# Patient Record
Sex: Female | Born: 1965 | Race: White | Hispanic: No | Marital: Married | Smoking: Never smoker
Health system: Southern US, Community
[De-identification: ages and names within clinical notes are randomized; demographics above are authoritative.]

## PROBLEM LIST (undated history)

## (undated) DIAGNOSIS — I10 Essential (primary) hypertension: Secondary | ICD-10-CM

## (undated) DIAGNOSIS — N200 Calculus of kidney: Secondary | ICD-10-CM

## (undated) DIAGNOSIS — F32A Depression, unspecified: Secondary | ICD-10-CM

## (undated) DIAGNOSIS — G4730 Sleep apnea, unspecified: Secondary | ICD-10-CM

## (undated) DIAGNOSIS — F329 Major depressive disorder, single episode, unspecified: Secondary | ICD-10-CM

## (undated) DIAGNOSIS — K219 Gastro-esophageal reflux disease without esophagitis: Secondary | ICD-10-CM

## (undated) HISTORY — PX: CHOLECYSTECTOMY: SHX55

## (undated) HISTORY — PX: GASTRIC BYPASS: SHX52

## (undated) HISTORY — DX: Calculus of kidney: N20.0

---

## 2011-02-12 DIAGNOSIS — J019 Acute sinusitis, unspecified: Secondary | ICD-10-CM | POA: Insufficient documentation

## 2015-06-20 DIAGNOSIS — Z8742 Personal history of other diseases of the female genital tract: Secondary | ICD-10-CM | POA: Insufficient documentation

## 2015-06-20 DIAGNOSIS — K219 Gastro-esophageal reflux disease without esophagitis: Secondary | ICD-10-CM | POA: Insufficient documentation

## 2015-06-20 DIAGNOSIS — E119 Type 2 diabetes mellitus without complications: Secondary | ICD-10-CM | POA: Insufficient documentation

## 2015-06-20 DIAGNOSIS — F32A Depression, unspecified: Secondary | ICD-10-CM | POA: Insufficient documentation

## 2015-06-20 DIAGNOSIS — F329 Major depressive disorder, single episode, unspecified: Secondary | ICD-10-CM | POA: Insufficient documentation

## 2015-06-20 DIAGNOSIS — G4733 Obstructive sleep apnea (adult) (pediatric): Secondary | ICD-10-CM | POA: Insufficient documentation

## 2016-06-26 DIAGNOSIS — E669 Obesity, unspecified: Secondary | ICD-10-CM | POA: Insufficient documentation

## 2018-02-07 ENCOUNTER — Encounter: Payer: Self-pay | Admitting: Emergency Medicine

## 2018-02-07 ENCOUNTER — Encounter
Admission: EM | Disposition: A | Discharge status: Home / Self Care | Payer: Self-pay | Source: Ambulatory Visit | Attending: Internal Medicine

## 2018-02-07 ENCOUNTER — Inpatient Hospital Stay: Payer: BLUE CROSS/BLUE SHIELD | Admitting: Certified Registered"

## 2018-02-07 ENCOUNTER — Inpatient Hospital Stay
Admission: EM | Admit: 2018-02-07 | Discharge: 2018-02-10 | DRG: 854 | Disposition: A | Discharge status: Home / Self Care | Payer: BLUE CROSS/BLUE SHIELD | Source: Ambulatory Visit | Attending: Internal Medicine | Admitting: Internal Medicine

## 2018-02-07 ENCOUNTER — Emergency Department: Payer: BLUE CROSS/BLUE SHIELD

## 2018-02-07 ENCOUNTER — Other Ambulatory Visit: Payer: Self-pay

## 2018-02-07 DIAGNOSIS — Z793 Long term (current) use of hormonal contraceptives: Secondary | ICD-10-CM

## 2018-02-07 DIAGNOSIS — N136 Pyonephrosis: Secondary | ICD-10-CM | POA: Diagnosis present

## 2018-02-07 DIAGNOSIS — Z87440 Personal history of urinary (tract) infections: Secondary | ICD-10-CM

## 2018-02-07 DIAGNOSIS — Z880 Allergy status to penicillin: Secondary | ICD-10-CM

## 2018-02-07 DIAGNOSIS — Z9884 Bariatric surgery status: Secondary | ICD-10-CM

## 2018-02-07 DIAGNOSIS — I10 Essential (primary) hypertension: Secondary | ICD-10-CM | POA: Diagnosis present

## 2018-02-07 DIAGNOSIS — Z7984 Long term (current) use of oral hypoglycemic drugs: Secondary | ICD-10-CM | POA: Diagnosis not present

## 2018-02-07 DIAGNOSIS — Z9049 Acquired absence of other specified parts of digestive tract: Secondary | ICD-10-CM

## 2018-02-07 DIAGNOSIS — R1032 Left lower quadrant pain: Secondary | ICD-10-CM | POA: Diagnosis present

## 2018-02-07 DIAGNOSIS — Z9989 Dependence on other enabling machines and devices: Secondary | ICD-10-CM

## 2018-02-07 DIAGNOSIS — Z79899 Other long term (current) drug therapy: Secondary | ICD-10-CM | POA: Diagnosis not present

## 2018-02-07 DIAGNOSIS — N201 Calculus of ureter: Secondary | ICD-10-CM | POA: Diagnosis not present

## 2018-02-07 DIAGNOSIS — A419 Sepsis, unspecified organism: Principal | ICD-10-CM | POA: Diagnosis present

## 2018-02-07 DIAGNOSIS — G4733 Obstructive sleep apnea (adult) (pediatric): Secondary | ICD-10-CM | POA: Diagnosis present

## 2018-02-07 HISTORY — DX: Essential (primary) hypertension: I10

## 2018-02-07 HISTORY — PX: CYSTOSCOPY WITH URETEROSCOPY AND STENT PLACEMENT: SHX6377

## 2018-02-07 LAB — URINALYSIS, COMPLETE (UACMP) WITH MICROSCOPIC
Bilirubin Urine: NEGATIVE
Glucose, UA: 50 mg/dL — AB
Ketones, ur: NEGATIVE mg/dL
Nitrite: POSITIVE — AB
Protein, ur: 30 mg/dL — AB
SPECIFIC GRAVITY, URINE: 1.021 (ref 1.005–1.030)
WBC, UA: >50 WBC/hpf — ABNORMAL HIGH (ref 0–5)
pH: 5 (ref 5.0–8.0)

## 2018-02-07 LAB — COMPREHENSIVE METABOLIC PANEL
ALT: 47 U/L — ABNORMAL HIGH (ref 0–44)
AST: 55 U/L — ABNORMAL HIGH (ref 15–41)
Albumin: 3.9 g/dL (ref 3.5–5.0)
Alkaline Phosphatase: 70 U/L (ref 38–126)
Anion gap: 7 (ref 5–15)
BUN: 17 mg/dL (ref 6–20)
CHLORIDE: 103 mmol/L (ref 98–111)
CO2: 26 mmol/L (ref 22–32)
Calcium: 8.4 mg/dL — ABNORMAL LOW (ref 8.9–10.3)
Creatinine, Ser: 0.86 mg/dL (ref 0.44–1.00)
GFR calc Af Amer: >60 mL/min (ref >60)
Glucose, Bld: 173 mg/dL — ABNORMAL HIGH (ref 70–99)
Potassium: 4.1 mmol/L (ref 3.5–5.1)
Sodium: 136 mmol/L (ref 135–145)
Total Bilirubin: 1.1 mg/dL (ref 0.3–1.2)
Total Protein: 7.2 g/dL (ref 6.5–8.1)

## 2018-02-07 LAB — CBC
HCT: 38.4 % (ref 36.0–46.0)
Hemoglobin: 13 g/dL (ref 12.0–15.0)
MCH: 30.1 pg (ref 26.0–34.0)
MCHC: 33.9 g/dL (ref 30.0–36.0)
MCV: 88.9 fL (ref 80.0–100.0)
Platelets: 232 10*3/uL (ref 150–400)
RBC: 4.32 MIL/uL (ref 3.87–5.11)
RDW: 12 % (ref 11.5–15.5)
WBC: 16.1 10*3/uL — ABNORMAL HIGH (ref 4.0–10.5)
nRBC: 0 % (ref 0.0–0.2)

## 2018-02-07 LAB — LIPASE, BLOOD: Lipase: 36 U/L (ref 11–51)

## 2018-02-07 LAB — POCT PREGNANCY, URINE: PREG TEST UR: NEGATIVE

## 2018-02-07 SURGERY — CYSTOURETEROSCOPY, WITH STENT INSERTION
Anesthesia: General | Laterality: Left

## 2018-02-07 MED ORDER — ONDANSETRON HCL 4 MG PO TABS
4.0000 mg | ORAL_TABLET | Freq: Four times a day (QID) | ORAL | Status: DC | PRN
  Filled 2018-02-07: qty 1

## 2018-02-07 MED ORDER — ACETAMINOPHEN 650 MG RE SUPP
650.0000 mg | Freq: Four times a day (QID) | RECTAL | Status: DC | PRN
  Filled 2018-02-07: qty 1

## 2018-02-07 MED ORDER — ONDANSETRON HCL 4 MG/2ML IJ SOLN
INTRAMUSCULAR | Status: AC
  Filled 2018-02-07: qty 2

## 2018-02-07 MED ORDER — GENTAMICIN SULFATE 40 MG/ML IJ SOLN
2.5000 mg/kg | Freq: Once | INTRAVENOUS | Status: DC
  Filled 2018-02-07: qty 5

## 2018-02-07 MED ORDER — DEXAMETHASONE SODIUM PHOSPHATE 10 MG/ML IJ SOLN
INTRAMUSCULAR | Status: DC | PRN
  Administered 2018-02-07: 10 mg via INTRAVENOUS

## 2018-02-07 MED ORDER — ROCURONIUM BROMIDE 100 MG/10ML IV SOLN
INTRAVENOUS | Status: DC | PRN
  Administered 2018-02-07: 20 mg via INTRAVENOUS

## 2018-02-07 MED ORDER — LACTATED RINGERS IV SOLN
INTRAVENOUS | Status: DC
  Administered 2018-02-07 – 2018-02-09 (×5): via INTRAVENOUS

## 2018-02-07 MED ORDER — ACETAMINOPHEN 325 MG PO TABS
650.0000 mg | ORAL_TABLET | Freq: Once | ORAL | Status: AC
  Administered 2018-02-07: 650 mg via ORAL
  Filled 2018-02-07: qty 2

## 2018-02-07 MED ORDER — GLYCOPYRROLATE 0.2 MG/ML IJ SOLN
INTRAMUSCULAR | Status: AC
  Filled 2018-02-07: qty 1

## 2018-02-07 MED ORDER — MORPHINE SULFATE (PF) 2 MG/ML IV SOLN: 2.0000 mg | INTRAVENOUS | Status: DC | PRN

## 2018-02-07 MED ORDER — ENOXAPARIN SODIUM 40 MG/0.4ML ~~LOC~~ SOLN
40.0000 mg | SUBCUTANEOUS | Status: DC
  Administered 2018-02-08 – 2018-02-10 (×3): 40 mg via SUBCUTANEOUS
  Filled 2018-02-07 (×3): qty 0.4

## 2018-02-07 MED ORDER — MIDAZOLAM HCL 2 MG/2ML IJ SOLN
INTRAMUSCULAR | Status: DC | PRN
  Administered 2018-02-07: 2 mg via INTRAVENOUS

## 2018-02-07 MED ORDER — PANTOPRAZOLE SODIUM 40 MG PO TBEC
40.0000 mg | DELAYED_RELEASE_TABLET | Freq: Every day | ORAL | Status: DC
  Administered 2018-02-08 – 2018-02-10 (×3): 40 mg via ORAL
  Filled 2018-02-07 (×3): qty 1

## 2018-02-07 MED ORDER — POLYETHYLENE GLYCOL 3350 17 G PO PACK
17.0000 g | PACK | Freq: Every day | ORAL | Status: DC | PRN
  Filled 2018-02-07: qty 1

## 2018-02-07 MED ORDER — SODIUM CHLORIDE 0.9 % IV BOLUS: 1000.0000 mL | Freq: Once | INTRAVENOUS | Status: DC

## 2018-02-07 MED ORDER — HYDROCODONE-ACETAMINOPHEN 5-325 MG PO TABS: 1.0000 | ORAL_TABLET | ORAL | Status: DC | PRN

## 2018-02-07 MED ORDER — ONDANSETRON HCL 4 MG/2ML IJ SOLN
INTRAMUSCULAR | Status: DC | PRN
  Administered 2018-02-07: 4 mg via INTRAVENOUS

## 2018-02-07 MED ORDER — LIDOCAINE HCL (PF) 2 % IJ SOLN
INTRAMUSCULAR | Status: AC
  Filled 2018-02-07: qty 10

## 2018-02-07 MED ORDER — ONDANSETRON HCL 4 MG/2ML IJ SOLN: 4.0000 mg | Freq: Once | INTRAMUSCULAR | Status: DC | PRN

## 2018-02-07 MED ORDER — FENTANYL CITRATE (PF) 100 MCG/2ML IJ SOLN
INTRAMUSCULAR | Status: AC
  Filled 2018-02-07: qty 2

## 2018-02-07 MED ORDER — TAMSULOSIN HCL 0.4 MG PO CAPS
0.4000 mg | ORAL_CAPSULE | Freq: Every day | ORAL | Status: DC
  Administered 2018-02-08 – 2018-02-09 (×2): 0.4 mg via ORAL
  Filled 2018-02-07 (×2): qty 1

## 2018-02-07 MED ORDER — FENTANYL CITRATE (PF) 100 MCG/2ML IJ SOLN: 25.0000 ug | INTRAMUSCULAR | Status: DC | PRN

## 2018-02-07 MED ORDER — ONDANSETRON HCL 4 MG/2ML IJ SOLN: 4.0000 mg | Freq: Four times a day (QID) | INTRAMUSCULAR | Status: DC | PRN

## 2018-02-07 MED ORDER — SODIUM CHLORIDE 0.9 % IV SOLN: 1000.0000 mL | Freq: Once | INTRAVENOUS | Status: DC

## 2018-02-07 MED ORDER — FENTANYL CITRATE (PF) 100 MCG/2ML IJ SOLN
INTRAMUSCULAR | Status: DC | PRN
  Administered 2018-02-07 (×2): 50 ug via INTRAVENOUS

## 2018-02-07 MED ORDER — BELLADONNA ALKALOIDS-OPIUM 16.2-60 MG RE SUPP: 1.0000 | Freq: Three times a day (TID) | RECTAL | Status: DC | PRN

## 2018-02-07 MED ORDER — DEXAMETHASONE SODIUM PHOSPHATE 10 MG/ML IJ SOLN
INTRAMUSCULAR | Status: AC
  Filled 2018-02-07: qty 1

## 2018-02-07 MED ORDER — ALBUTEROL SULFATE (2.5 MG/3ML) 0.083% IN NEBU: 2.5000 mg | INHALATION_SOLUTION | RESPIRATORY_TRACT | Status: DC | PRN

## 2018-02-07 MED ORDER — VENLAFAXINE HCL ER 225 MG PO TB24: 225.0000 mg | ORAL_TABLET | Freq: Every day | ORAL | Status: DC

## 2018-02-07 MED ORDER — SUGAMMADEX SODIUM 200 MG/2ML IV SOLN
INTRAVENOUS | Status: DC | PRN
  Administered 2018-02-07: 200 mg via INTRAVENOUS

## 2018-02-07 MED ORDER — LIDOCAINE HCL (CARDIAC) PF 100 MG/5ML IV SOSY
PREFILLED_SYRINGE | INTRAVENOUS | Status: DC | PRN
  Administered 2018-02-07: 50 mg via INTRAVENOUS

## 2018-02-07 MED ORDER — VENLAFAXINE HCL ER 75 MG PO CP24
150.0000 mg | ORAL_CAPSULE | Freq: Every day | ORAL | Status: DC
  Administered 2018-02-08 – 2018-02-10 (×3): 150 mg via ORAL
  Filled 2018-02-07 (×3): qty 2

## 2018-02-07 MED ORDER — PROPOFOL 10 MG/ML IV BOLUS
INTRAVENOUS | Status: DC | PRN
  Administered 2018-02-07: 160 mg via INTRAVENOUS
  Administered 2018-02-07: 40 mg via INTRAVENOUS

## 2018-02-07 MED ORDER — HYDRALAZINE HCL 20 MG/ML IJ SOLN: 10.0000 mg | Freq: Four times a day (QID) | INTRAMUSCULAR | Status: DC | PRN

## 2018-02-07 MED ORDER — PROPOFOL 10 MG/ML IV BOLUS
INTRAVENOUS | Status: AC
  Filled 2018-02-07: qty 20

## 2018-02-07 MED ORDER — SODIUM CHLORIDE 0.9 % IV SOLN
1.0000 g | INTRAVENOUS | Status: DC
  Administered 2018-02-08 – 2018-02-09 (×2): 1 g via INTRAVENOUS
  Filled 2018-02-07: qty 10
  Filled 2018-02-07 (×2): qty 1

## 2018-02-07 MED ORDER — SODIUM CHLORIDE 0.9 % IV SOLN
1.0000 g | Freq: Once | INTRAVENOUS | Status: AC
  Administered 2018-02-07: 1 g via INTRAVENOUS
  Filled 2018-02-07: qty 1

## 2018-02-07 MED ORDER — GLYCOPYRROLATE 0.2 MG/ML IJ SOLN
INTRAMUSCULAR | Status: DC | PRN
  Administered 2018-02-07: 0.2 mg via INTRAVENOUS

## 2018-02-07 MED ORDER — SUCCINYLCHOLINE CHLORIDE 20 MG/ML IJ SOLN
INTRAMUSCULAR | Status: DC | PRN
  Administered 2018-02-07: 100 mg via INTRAVENOUS

## 2018-02-07 MED ORDER — SUCCINYLCHOLINE CHLORIDE 20 MG/ML IJ SOLN
INTRAMUSCULAR | Status: AC
  Filled 2018-02-07: qty 1

## 2018-02-07 MED ORDER — ACETAMINOPHEN 325 MG PO TABS
650.0000 mg | ORAL_TABLET | Freq: Four times a day (QID) | ORAL | Status: DC | PRN
  Administered 2018-02-08 – 2018-02-09 (×4): 650 mg via ORAL
  Filled 2018-02-07 (×4): qty 2

## 2018-02-07 MED ORDER — MIDAZOLAM HCL 2 MG/2ML IJ SOLN
INTRAMUSCULAR | Status: AC
  Filled 2018-02-07: qty 2

## 2018-02-07 MED ORDER — SUGAMMADEX SODIUM 200 MG/2ML IV SOLN
INTRAVENOUS | Status: AC
  Filled 2018-02-07: qty 2

## 2018-02-07 SURGICAL SUPPLY — 35 items
BAG DRAIN CYSTO-URO LG1000N (MISCELLANEOUS) ×2 IMPLANT
BRUSH SCRUB EZ 1% IODOPHOR (MISCELLANEOUS) ×2 IMPLANT
BULB IRRIG PATHFIND (MISCELLANEOUS) IMPLANT
CATH FOL 2WAY LX 16X30 (CATHETERS) ×1 IMPLANT
CATH URETL 5X70 OPEN END (CATHETERS) IMPLANT
CNTNR SPEC 2.5X3XGRAD LEK (MISCELLANEOUS)
CONT SPEC 4OZ STER OR WHT (MISCELLANEOUS)
CONT SPEC 4OZ STRL OR WHT (MISCELLANEOUS)
CONTAINER SPEC 2.5X3XGRAD LEK (MISCELLANEOUS) IMPLANT
DRAPE UTILITY 15X26 TOWEL STRL (DRAPES) ×2 IMPLANT
FIBER LASER LITHO 273 (Laser) IMPLANT
GLOVE BIOGEL PI IND STRL 7.5 (GLOVE) ×1 IMPLANT
GLOVE BIOGEL PI INDICATOR 7.5 (GLOVE) ×1
GOWN STRL REUS W/ TWL LRG LVL3 (GOWN DISPOSABLE) ×1 IMPLANT
GOWN STRL REUS W/ TWL XL LVL3 (GOWN DISPOSABLE) ×1 IMPLANT
GOWN STRL REUS W/TWL LRG LVL3 (GOWN DISPOSABLE) ×2
GOWN STRL REUS W/TWL XL LVL3 (GOWN DISPOSABLE) ×2
INFUSOR MANOMETER BAG 3000ML (MISCELLANEOUS) ×2 IMPLANT
INTRODUCER DILATOR DOUBLE (INTRODUCER) IMPLANT
KIT TURNOVER CYSTO (KITS) ×2 IMPLANT
PACK CYSTO AR (MISCELLANEOUS) ×2 IMPLANT
SENSORWIRE 0.038 NOT ANGLED (WIRE) ×2
SET CYSTO W/LG BORE CLAMP LF (SET/KITS/TRAYS/PACK) ×2 IMPLANT
SHEATH URETERAL 12FRX35CM (MISCELLANEOUS) IMPLANT
SOL .9 NS 3000ML IRR  AL (IV SOLUTION) ×1
SOL .9 NS 3000ML IRR AL (IV SOLUTION) ×1
SOL .9 NS 3000ML IRR UROMATIC (IV SOLUTION) ×1 IMPLANT
STENT URET 6FRX24 CONTOUR (STENTS) IMPLANT
STENT URET 6FRX26 CONTOUR (STENTS) ×1 IMPLANT
SURGILUBE 2OZ TUBE FLIPTOP (MISCELLANEOUS) ×2 IMPLANT
SYR 10ML LL (SYRINGE) ×2 IMPLANT
TUBING ART PRESS 48 MALE/FEM (TUBING) IMPLANT
VALVE UROSEAL ADJ ENDO (VALVE) IMPLANT
WATER STERILE IRR 1000ML POUR (IV SOLUTION) ×2 IMPLANT
WIRE SENSOR 0.038 NOT ANGLED (WIRE) ×1 IMPLANT

## 2018-02-07 NOTE — Anesthesia Preprocedure Evaluation (Signed)
Anesthesia Evaluation  Patient identified by MRN, date of birth, ID band Patient awake    Reviewed: Allergy & Precautions, NPO status , Patient's Chart, lab work & pertinent test results  Airway Mallampati: III       Dental   Pulmonary neg pulmonary ROS,    Pulmonary exam normal        Cardiovascular hypertension, Normal cardiovascular exam     Neuro/Psych negative neurological ROS  negative psych ROS   GI/Hepatic negative GI ROS, Neg liver ROS,   Endo/Other  negative endocrine ROS  Renal/GU stone     Musculoskeletal negative musculoskeletal ROS (+)   Abdominal Normal abdominal exam  (+)   Peds negative pediatric ROS (+)  Hematology negative hematology ROS (+)   Anesthesia Other Findings Past Medical History: No date: Hypertension  Reproductive/Obstetrics                             Anesthesia Physical Anesthesia Plan  ASA: II and emergent  Anesthesia Plan: General   Post-op Pain Management:    Induction: Intravenous, Rapid sequence and Cricoid pressure planned  PONV Risk Score and Plan:   Airway Management Planned: Oral ETT  Additional Equipment:   Intra-op Plan:   Post-operative Plan: Extubation in OR  Informed Consent: I have reviewed the patients History and Physical, chart, labs and discussed the procedure including the risks, benefits and alternatives for the proposed anesthesia with the patient or authorized representative who has indicated his/her understanding and acceptance.   Dental advisory given  Plan Discussed with: CRNA and Surgeon  Anesthesia Plan Comments:         Anesthesia Quick Evaluation

## 2018-02-07 NOTE — H&P (Signed)
[facility] [facility] [place] [facility]   PATIENT NAME: Margaret Guzman    MR#:  [id number]  DATE OF BIRTH:  September 16, 1965  DATE OF ADMISSION:  02/07/2018  PRIMARY CARE PHYSICIAN: Patient, No Pcp Per   REQUESTING/REFERRING PHYSICIAN: Dr. Cyril LoosenKinner  CHIEF COMPLAINT:   Chief Complaint  Patient presents with  . Abdominal Pain    HISTORY OF PRESENT ILLNESS:  Margaret Guzman  is a 52 y.o. 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  Was seen at [facility] walk-in clinic and referred to the emergency room.  Here patient has been found to have sepsis with fever of 103, tachycardia and CT scan of the abdomen and pelvis showing left ureteral 6 mm stone with obstruction.  UTI on urine analysis.  Urology contacted and patient is being admitted to the hospital.  Last time she had anything to eat or drink was 8:30 AM. Complains of dysuria but no hematuria.  PAST MEDICAL HISTORY:   Past Medical History:  Diagnosis Date  . Hypertension     PAST SURGICAL HISTORY:   Past Surgical History:  Procedure Laterality Date  . CESAREAN SECTION    . CHOLECYSTECTOMY    . GASTRIC BYPASS      SOCIAL HISTORY:   Social History   Tobacco Use  . Smoking status: Never Smoker  . Smokeless tobacco: Never Used  Substance Use Topics  . Alcohol use: Not on file    FAMILY HISTORY:   Nephrolithiasis in father, sister DRUG ALLERGIES:   Allergies  Allergen Reactions  . Augmentin [Amoxicillin-Pot Clavulanate]     REVIEW OF SYSTEMS:   Review of Systems  Constitutional: Positive for chills, fever and malaise/fatigue. Negative for weight loss.  HENT: Negative for hearing loss, nosebleeds and sore throat.   Eyes: Negative for blurred vision, double vision and pain.  Respiratory: Negative for cough, hemoptysis, sputum production, shortness of breath and wheezing.   Cardiovascular:  Negative for chest pain, palpitations, orthopnea and leg swelling.  Gastrointestinal: Positive for abdominal pain and nausea. Negative for constipation, diarrhea, heartburn and vomiting.  Genitourinary: Positive for dysuria. Negative for hematuria.  Musculoskeletal: Negative for back pain, falls, joint pain and myalgias.  Skin: Negative for rash.  Neurological: Negative for dizziness, tremors, sensory change, speech change, focal weakness, seizures and headaches.  Endo/Heme/Allergies: Does not bruise/bleed easily.  Psychiatric/Behavioral: Negative for depression and memory loss. The patient is not nervous/anxious.     MEDICATIONS AT HOME:   Prior to Admission medications   Not on File     VITAL SIGNS:  Blood pressure 122/78, pulse (!) 109, temperature (!) 102.5 F (39.2 C), temperature source Oral, height 5' 5.5" (1.664 m), weight 79.4 kg, last menstrual period 01/29/2018, SpO2 98 %.  PHYSICAL EXAMINATION:  Physical Exam  GENERAL:  52 y.o.-year-old patient lying in the bed EYES: Pupils equal, round, reactive to light and accommodation. No scleral icterus. Extraocular muscles intact.  HEENT: Head atraumatic, normocephalic. Oropharynx and nasopharynx clear. No oropharyngeal erythema, moist oral mucosa  NECK:  Supple, no jugular venous distention. No thyroid enlargement, no tenderness.  LUNGS: Normal breath sounds bilaterally, no wheezing, rales, rhonchi. No use of accessory muscles of respiration.  CARDIOVASCULAR: S1, S2 normal. No murmurs, rubs, or gallops.  ABDOMEN: Soft, left-sided tenderness, nondistended. Bowel sounds present. No organomegaly or mass.  EXTREMITIES: No pedal edema, cyanosis, or clubbing. + 2 pedal & radial pulses  b/l.   NEUROLOGIC: Cranial nerves II through XII are intact. No focal Motor or sensory deficits appreciated b/l PSYCHIATRIC: The patient is alert and oriented x 3. Good affect.  SKIN: No obvious rash, lesion, or ulcer.   LABORATORY PANEL:   CBC Recent  Labs  Lab 02/07/18 1414  WBC 16.1*  HGB 13.0  HCT 38.4  PLT 232   ------------------------------------------------------------------------------------------------------------------  Chemistries  Recent Labs  Lab 02/07/18 1414  NA 136  K 4.1  CL 103  CO2 26  GLUCOSE 173*  BUN 17  CREATININE 0.86  CALCIUM 8.4*  AST 55*  ALT 47*  ALKPHOS 70  BILITOT 1.1   ------------------------------------------------------------------------------------------------------------------  Cardiac Enzymes No results for input(s): TROPONINI in the last 168 hours. ------------------------------------------------------------------------------------------------------------------  RADIOLOGY:  Ct Renal Stone Study  Result Date: 02/07/2018 CLINICAL DATA:  Initial evaluation for acute left-sided flank pain, urinary urgency. EXAM: CT ABDOMEN AND PELVIS WITHOUT CONTRAST TECHNIQUE: Multidetector CT imaging of the abdomen and pelvis was performed following the standard protocol without IV contrast. COMPARISON:  None available. FINDINGS: Lower chest: Visualized lung bases are clear. Hepatobiliary: Limited noncontrast evaluation of the liver is unremarkable. Gallbladder surgically absent. No biliary dilatation. Pancreas: Pancreas within normal limits. Spleen: Spleen within normal limits. Adrenals/Urinary Tract: Adrenal glands are normal. Right kidney unremarkable without nephrolithiasis or hydronephrosis. No radiopaque calculi seen along the course of the right renal collecting system. No right-sided hydroureter. On the left, there is an obstructive 6 mm stone position within the distal left ureter with secondary mild to moderate left hydroureteronephrosis. Associated perinephric and periureteral fat stranding. Additional 2 adjacent nonobstructive calculi present within the interpolar left kidney, largest of which measures 6 mm. No other left-sided ureteral stones. Bladder largely decompressed without acute  abnormality. No layering stones within the bladder lumen. Stomach/Bowel: Sequelae of prior gastric bypass. No evidence for bowel obstruction. No acute inflammatory changes seen about the bowels. Moderate retained stool within the colon. Vascular/Lymphatic: Intra-abdominal aorta of normal caliber. No pathologically enlarged intra-abdominal or pelvic lymph nodes. Reproductive: Uterus within normal limits. 3 cm simple left ovarian cyst, most consistent with a benign physiologic follicular cyst. Ovaries otherwise unremarkable. Other: Small volume free fluid within the pelvic cul-de-sac, presumably physiologic. No free intraperitoneal air. Musculoskeletal: No acute osseous abnormality. No discrete lytic or blastic osseous lesions. IMPRESSION: 1. 6 mm obstructive stone within the distal left ureter with secondary mild to moderate left hydroureteronephrosis. 2. Additional nonobstructive left renal nephrolithiasis as above. 3. Moderate retained stool diffusely throughout the colon, suggesting constipation. 4. Sequelae of prior gastric bypass and cholecystectomy. Electronically Signed   By: Rise MuBenjamin  McClintock M.D.   On: 02/07/2018 16:08     IMPRESSION AND PLAN:   *Left ureteral stone of 6 mm with obstruction, UTI and sepsis.  Patient is critically ill.  We will bolus IV fluids.  Check lactic acid.  Cultures.  N.p.o. Dr. Hilda LiasSininsky of urology will see patient and OR soon. Start IV ceftriaxone.  Urology has requested 1 dose of gentamicin that has been ordered.  *Hypertension.  Hold home medications.  Hydralazine IV as needed  *DVT prophylaxis with Lovenox.  Dosing to start after procedure.  All the records are reviewed and case discussed with ED provider. Management plans discussed with the patient, family and they are in agreement.  CODE STATUS: Full code  TOTAL CC TIME TAKING CARE OF THIS PATIENT: 40 minutes.   Orie FishermanSrikar R Tyrhonda Georgiades M.D on 02/07/2018 at 4:37 PM  Between 7am to 6pm - Pager -  [phone]  After 6pm go to [email or web address] - password [email or web address]  [facility]  Office  [phone]  CC: Primary care physician; Patient, No Pcp Per  Note: This dictation was prepared with Dragon dictation along with smaller phrase technology. Any transcriptional errors that result from this process are unintentional.

## 2018-02-07 NOTE — Op Note (Signed)
Date of procedure: 02/07/18  Preoperative diagnosis:  1. 6 mm left distal ureteral stone, sepsis from urinary source  Postoperative diagnosis:  1. Same  Procedure: 1. Cystoscopy, left ureteral stent placement, left retrograde pyelogram  Surgeon: Legrand RamsBrian Jeanette Rauth, MD  Anesthesia: General  Complications: None  Intraoperative findings:  1.  No obvious bladder tumors 2.  Purulent drainage from left collecting system, aspirated and sent for Gram stain /culture 3.  Uncomplicated left ureteral stent placement  EBL: Minimal  Specimens: Left renal aspirate for Gram stain and culture  Drains: Left 6 French by 26 cm ureteral stent, 16 French Foley  Indication: Margaret Guzman is a [age] y.o. patient with left-sided flank pain and fever to 102.5.  CT showed a 6 mm left distal ureteral stone and urine was concerning for infection.  After reviewing the management options for treatment, they elected to proceed with the above surgical procedure(s). We have discussed the potential benefits and risks of the procedure, side effects of the proposed treatment, the likelihood of the patient achieving the goals of the procedure, and any potential problems that might occur during the procedure or recuperation. Informed consent has been obtained.  Description of procedure:  The patient was taken to the operating room and general anesthesia was induced.  The patient was placed in the dorsal lithotomy position, prepped and draped in the usual sterile fashion, and preoperative antibiotics(ceftriaxone and gentamycin in ED) were administered. A preoperative time-out was performed.   The 21 French cystoscope was used to intubate the urethra.  There was purulent urine in the bladder and this was irrigated out.  We turned our attention to the left ureteral orifice and a sensor wire was advanced under fluoroscopic vision alongside the distal ureteral stone up to the collecting system.  A 5 French access catheter was  advanced over the wire into the collecting system and the wire removed.  There was a hydronephrotic drip noted.  I was able to aspirate approximately 20 cc of frankly purulent infected urine.  This was sent for Gram stain and culture.  2 cc of contrast were injected to aid in stent placement.  A 6 French by 26 cm ureteral stent was advanced over the wire with a good curl noted in the upper pole as well as under direct vision in the bladder.  Purulent urine was seen to drain through the side-ports of the stent.  A 16 French Foley was placed to maximize drainage.  Disposition: Stable to PACU  Plan: Admission to hospitalist, continue sepsis resuscitation Foley can be removed when afebrile greater than 24 hours Narrow antibiotics as able We will arrange follow-up for definitive outpatient ureteroscopy/laser lithotripsy in 2 to 3 weeks  Legrand RamsBrian Teigan Manner, MD

## 2018-02-07 NOTE — Anesthesia Post-op Follow-up Note (Signed)
Anesthesia QCDR form completed.        

## 2018-02-07 NOTE — ED Provider Notes (Signed)
[facility] Emergency Department Provider Note   ____________________________________________    I have reviewed the triage vital signs and the nursing notes.   HISTORY  Chief Complaint Abdominal Pain     HPI Margaret Guzman is a 52 y.o. female who presents with complaints of moderate left lower quadrant abdominal pain and left flank pain.  Patient reports symptoms started 2 days ago but worsened significantly today.  She does report urgency but no dysuria.  Has had chills.  Does have a history of urinary tract infections.  No history of kidney stones.  Had gastric bypass 1 year ago.  Has not take anything for this.  Has not eaten since breakfast today  Past Medical History:  Diagnosis Date  . Hypertension     Patient Active Problem List   Diagnosis Date Noted  . Left ureteral stone 02/07/2018    Past Surgical History:  Procedure Laterality Date  . CESAREAN SECTION    . CHOLECYSTECTOMY    . GASTRIC BYPASS      Prior to Admission medications   Medication Sig Start Date End Date Taking? Authorizing Provider  metFORMIN (GLUCOPHAGE-XR) 500 MG 24 hr tablet Take 1 tablet by mouth daily. 04/12/10  Yes [provider]  RABEprazole (ACIPHEX) 20 MG tablet Take 1 tablet by mouth daily. 04/12/10  Yes [provider]  valsartan (DIOVAN) 320 MG tablet Take 1 tablet by mouth daily. 04/12/10  Yes [provider]  venlafaxine XR (EFFEXOR-XR) 150 MG 24 hr capsule Take 1 capsule by mouth daily. 04/12/10  Yes [provider]  Calcium Citrate-Vitamin D (CALCIUM CITRATE+D3 PETITES) 200-250 MG-UNIT TABS Take 1 tablet by mouth 2 (two) times daily.    [provider]  canagliflozin (INVOKANA) 100 MG TABS tablet Take 1 tablet by mouth daily.    [provider]  erythromycin ophthalmic ointment Place 1 application into both eyes daily.    [provider]  etonogestrel (NEXPLANON) 68 MG IMPL implant Inject 68  mg into the skin once.    [provider]  glimepiride (AMARYL) 2 MG tablet Take 1 tablet by mouth daily.    [provider]  olmesartan (BENICAR) 20 MG tablet Take 1 tablet by mouth daily.    [provider]  olmesartan (BENICAR) 40 MG tablet Take 1 tablet by mouth daily.    [provider]  pantoprazole (PROTONIX) 40 MG tablet Take 1 tablet by mouth daily.    [provider]  triamcinolone cream (KENALOG) 0.1 % Apply 1 application topically daily.    [provider]  valACYclovir (VALTREX) 1000 MG tablet Take 1 tablet by mouth daily.    [provider]  Venlafaxine HCl 225 MG TB24 Take 1 capsule by mouth daily.    [provider]     Allergies Augmentin [amoxicillin-pot clavulanate]  History reviewed. No pertinent family history.  Social History Social History   Tobacco Use  . Smoking status: Never Smoker  . Smokeless tobacco: Never Used  Substance Use Topics  . Alcohol use: Not on file  . Drug use: Not on file    Review of Systems  Constitutional: Chills as above Eyes: No visual changes.  ENT: No sore throat. Cardiovascular: Denies chest pain. Respiratory: Denies shortness of breath. Gastrointestinal: As above Genitourinary: Negative for dysuria.  Urgency as above Musculoskeletal: Negative for back pain. Skin: Negative for rash. Neurological: Negative for headache   ____________________________________________   PHYSICAL EXAM:  VITAL SIGNS: ED Triage Vitals  Enc Vitals Group     BP 02/07/18 1401 122/78     Pulse Rate 02/07/18 1401 (!) 109     Resp 02/07/18 1742 13     Temp 02/07/18 1407 (!) 103.1 F (39.5 C)     Temp Source 02/07/18 1407 Oral     SpO2 02/07/18 1401 98 %     Weight 02/07/18 1408 79.4 kg (175 lb)     Height 02/07/18 1408 1.664 m (5' 5.5")     Head Circumference --      Peak Flow --      Pain Score 02/07/18 1408 8     Pain Loc --      Pain Edu? --      Excl. in GC? --      Constitutional: Alert and oriented. Eyes: Conjunctivae are normal.   Nose: No congestion/rhinnorhea. Mouth/Throat: Mucous membranes are moist.    Cardiovascular: Normal rate, regular rhythm. Grossly normal heart sounds.  Good peripheral circulation. Respiratory: Normal respiratory effort.  No retractions. Lungs CTAB. Gastrointestinal: Soft and nontender. No distention.  Left CVA tenderness Genitourinary: deferred Musculoskeletal:  Warm and well perfused Neurologic:  Normal speech and language. No gross focal neurologic deficits are appreciated.  Skin:  Skin is warm, dry and intact. No rash noted. Psychiatric: Mood and affect are normal. Speech and behavior are normal.  ____________________________________________   LABS (all labs ordered are listed, but only abnormal results are displayed)  Labs Reviewed  COMPREHENSIVE METABOLIC PANEL - Abnormal; Notable for the following components:      Result Value   Glucose, Bld 173 (*)    Calcium 8.4 (*)    AST 55 (*)    ALT 47 (*)    All other components within normal limits  CBC - Abnormal; Notable for the following components:   WBC 16.1 (*)    All other components within normal limits  URINALYSIS, COMPLETE (UACMP) WITH MICROSCOPIC - Abnormal; Notable for the following components:   Color, Urine AMBER (*)    APPearance TURBID (*)    Glucose, UA 50 (*)    Hgb urine dipstick SMALL (*)    Protein, ur 30 (*)    Nitrite POSITIVE (*)    Leukocytes, UA LARGE (*)    WBC, UA >50 (*)    Bacteria, UA MANY (*)    All other components within normal limits  GRAM STAIN  AEROBIC/ANAEROBIC CULTURE (SURGICAL/DEEP WOUND)  LIPASE, BLOOD  HIV ANTIBODY (ROUTINE TESTING W REFLEX)  BASIC METABOLIC PANEL  CBC  POCT PREGNANCY, URINE  POC URINE PREG, ED   ____________________________________________  EKG  None ____________________________________________  RADIOLOGY  CT scan demonstrates 6 mm distal left ureteral  stone ____________________________________________   PROCEDURES  Procedure(s) performed: No  Procedures   Critical Care performed: yes  CRITICAL CARE Performed by: Jene Everyobert Shadie Sweatman   Total critical care time 30 minutes  Critical care time was exclusive of separately billable procedures and treating other patients.  Critical care was necessary to treat or prevent imminent or life-threatening deterioration.  Critical care was time spent personally by me on the following activities: development of treatment plan with patient and/or surrogate as well as nursing, discussions with consultants, evaluation of patient's response to treatment, examination of patient, obtaining history from patient or surrogate, ordering and performing treatments and interventions, ordering and review of laboratory studies, ordering and review of radiographic studies, pulse oximetry and re-evaluation of patient's condition.  ____________________________________________   INITIAL IMPRESSION / ASSESSMENT AND PLAN / ED  COURSE  Pertinent labs & imaging results that were available during my care of the patient were reviewed by me and considered in my medical decision making (see chart for details).  Patient presents with left flank left lower quadrant pain, urgency.  Differential includes UTI, sepsis, pyelonephritis, less likely kidney stone.  Analysis is consistent with UTI.  The patient is febrile and tachycardic with an elevated white blood cell count.  she does have CVA tenderness suspicious for pyelonephritis.  I will obtain CT renal stone study to rule out kidney stone given her fever and tachycardia and white count.  Meanwhile I will treat with IV Rocephin  CT demonstrates ureterolithiasis, obstructive 6 mm left distal ureter stone.  Discussed this with Dr. Javier Glazier of urology who will place ureteral stent given patient's sepsis with obstructing stone and UTI.  He requested that I add on  gentamicin  Admitted the patient to the hospitalist service    ____________________________________________   FINAL CLINICAL IMPRESSION(S) / ED DIAGNOSES  Final diagnoses:  Sepsis without acute organ dysfunction, due to unspecified organism [facility])  Ureterolithiasis        Note:  This document was prepared using Dragon voice recognition software and may include unintentional dictation errors.    Jene Every, MD 02/07/18 [phone]

## 2018-02-07 NOTE — Consult Note (Signed)
   4:50 PM   Shene Owens Shark 09/18/1965 [id number]  CC: Fever, UTI, left flank pain  HPI: I saw Everlyn Budnick in the emergency department today in consultation from Dr. Darvin Neighbours.  She is a 52 year old female with a history of gastric bypass who reports 4 days of intermittent sharp and radiating left-sided flank and groin pain that has acutely worsened over the last 24 hours.  She also reports shaking chills and feeling feverish at home over the last 12 hours.  She denies any chest pain, shortness of breath, or nausea.  She does not have a history of nephrolithiasis.  There are no aggravating or alleviating factors.  Has been n.p.o. since 8:30 AM this morning.  Severity is severe.  CT scan in the emergency department was notable for left-sided hydronephrosis with a 6 mm left distal ureteral stone, and a 4 mm left midpole stone.  Urinalysis was grossly infected, leukocytosis to 16, and febrile to 102.5.  The patient lives in [place], [place].   PMH: Past Medical History:  Diagnosis Date  . Hypertension     Surgical History: Past Surgical History:  Procedure Laterality Date  . CESAREAN SECTION    . CHOLECYSTECTOMY    . GASTRIC BYPASS      Allergies:  Allergies  Allergen Reactions  . Augmentin [Amoxicillin-Pot Clavulanate]     Social History:  reports that she has never smoked. She has never used smokeless tobacco. No history on file for alcohol and drug.  ROS: Negative aside from those stated in HPI  Physical Exam: BP 122/78 (BP Location: Left Arm)   Pulse (!) 109   Temp (!) 102.5 F (39.2 C) (Oral)   Ht 5' 5.5" (1.664 m)   Wt 79.4 kg   LMP 01/29/2018   SpO2 98%   BMI 28.68 kg/m    Constitutional: Ill-appearing, uncomfortable Cardiovascular: Tachycardic, regular rhythm Respiratory: Clear to auscultation bilaterally GI: Abdomen is soft, nontender, nondistended, no abdominal masses GU: Left CVA tenderness Lymph: No cervical or inguinal lymphadenopathy. Skin:  No rashes, bruises or suspicious lesions. Neurologic: Grossly intact, no focal deficits, moving all 4 extremities. Psychiatric: Normal mood and affect.  Laboratory Data: Reviewed WBC 16 K Creatinine 0.86, EGFR greater than 60 Urinalysis nitrite positive, many bacteria, WBC clumps, greater than 50 WBCs Urine culture pending  Pertinent Imaging: I have personally reviewed the CT today, left 6 mm distal ureteral stone with upstream hydronephrosis, 4 mm midpole stone.  Assessment & Plan:   In summary, Ms. Stiggers is a healthy 52 year old female with history of gastric bypass and presentation consistent with an infected 23m left ureteral stone with sepsis from urinary source.  We discussed the need for urgent drainage with retrograde ureteral stent placement.  We discussed the risks and benefits at length, including bleeding, infection, stent related symptoms, and need for staged definitive management in 2 to 3 weeks in follow-up.  We briefly discussed the need for diet modifications including increased fluid intake, and calcium supplementation with meals in the setting of her prior gastric bypass, as well as need for close follow-up for stone prevention.  Gentamicin x1 in ED in addition to ceftriaxone Emergent left ureteral stent placement, admission to hospitalist for sepsis management Informed consent obtained and left side marked Continue broad-spectrum antibiotics, narrow as able pending cultures, recommend 10 to 14-day course   BBilley Co MD  [place] [place] [phone]

## 2018-02-07 NOTE — ED Notes (Signed)
Pt reports pain to her llq. Pt reports had the same pain 2 days ago,it went away and came back with a vengeance today. Denies NVD. Reports does have a fever. 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 Pt does reports urinary urgency.

## 2018-02-07 NOTE — ED Triage Notes (Signed)
Sent from [facility] for left lower abd pain

## 2018-02-07 NOTE — ED Triage Notes (Signed)
C?O LLQ abdominal pain and left lower back pain.  Initially pain started on Saturday, then resolved and returned today.  Also c/o urinary urgency.

## 2018-02-07 NOTE — Anesthesia Procedure Notes (Signed)
Procedure Name: Intubation Performed by: Shawntel Farnworth, CRNA Pre-anesthesia Checklist: Patient identified, Patient being monitored, Timeout performed, Emergency Drugs available and Suction available Patient Re-evaluated:Patient Re-evaluated prior to induction Oxygen Delivery Method: Circle system utilized Preoxygenation: Pre-oxygenation with 100% oxygen Induction Type: IV induction and Rapid sequence Laryngoscope Size: Miller and 2 Grade View: Grade I Tube type: Oral Tube size: 7.0 mm Number of attempts: 1 Airway Equipment and Method: Stylet Placement Confirmation: ETT inserted through vocal cords under direct vision,  positive ETCO2 and breath sounds checked- equal and bilateral Secured at: 21 cm Tube secured with: Tape Dental Injury: Teeth and Oropharynx as per pre-operative assessment        

## 2018-02-07 NOTE — ED Notes (Signed)
MD in room to review process and consent signed. Pt sister given belongings to keep safe. Pt in route to OR.

## 2018-02-07 NOTE — Transfer of Care (Signed)
Immediate Anesthesia Transfer of Care Note  Patient: Margaret Guzman  Procedure(s) Performed: CYSTOSCOPY WITH URETEROSCOPY AND STENT PLACEMENT (Left )  Patient Location: PACU  Anesthesia Type:General  Level of Consciousness: awake, alert  and oriented  Airway & Oxygen Therapy: Patient Spontanous Breathing and Patient connected to face mask oxygen  Post-op Assessment: Report given to RN and Post -op Vital signs reviewed and stable  Post vital signs: Reviewed  Last Vitals:  Vitals Value Taken Time  BP 104/72 02/07/2018  5:45 PM  Temp 37.4 C 02/07/2018  5:45 PM  Pulse 113 02/07/2018  5:45 PM  Resp 19 02/07/2018  5:45 PM  SpO2 100 % 02/07/2018  5:45 PM    Last Pain:  Vitals:   02/07/18 1611  TempSrc: Oral  PainSc:          Complications: No apparent anesthesia complications

## 2018-02-08 ENCOUNTER — Encounter: Payer: Self-pay | Admitting: Urology

## 2018-02-08 LAB — CBC
HCT: 35.6 % — ABNORMAL LOW (ref 36.0–46.0)
Hemoglobin: 11.5 g/dL — ABNORMAL LOW (ref 12.0–15.0)
MCH: 29.4 pg (ref 26.0–34.0)
MCHC: 32.3 g/dL (ref 30.0–36.0)
MCV: 91 fL (ref 80.0–100.0)
PLATELETS: 214 10*3/uL (ref 150–400)
RBC: 3.91 MIL/uL (ref 3.87–5.11)
RDW: 12.2 % (ref 11.5–15.5)
WBC: 11.6 10*3/uL — ABNORMAL HIGH (ref 4.0–10.5)
nRBC: 0 % (ref 0.0–0.2)

## 2018-02-08 LAB — BASIC METABOLIC PANEL
ANION GAP: 9 (ref 5–15)
BUN: 13 mg/dL (ref 6–20)
CO2: 25 mmol/L (ref 22–32)
Calcium: 8.4 mg/dL — ABNORMAL LOW (ref 8.9–10.3)
Chloride: 107 mmol/L (ref 98–111)
Creatinine, Ser: 0.74 mg/dL (ref 0.44–1.00)
GFR calc Af Amer: >60 mL/min (ref >60)
GFR calc non Af Amer: >60 mL/min (ref >60)
Glucose, Bld: 173 mg/dL — ABNORMAL HIGH (ref 70–99)
POTASSIUM: 3.9 mmol/L (ref 3.5–5.1)
Sodium: 141 mmol/L (ref 135–145)

## 2018-02-08 MED ORDER — GENTAMICIN SULFATE 40 MG/ML IJ SOLN
2.5000 mg/kg | Freq: Once | INTRAVENOUS | Status: AC
  Administered 2018-02-08: 210 mg via INTRAVENOUS
  Filled 2018-02-08: qty 5.25

## 2018-02-08 MED ORDER — DIPHENHYDRAMINE HCL 25 MG PO CAPS
25.0000 mg | ORAL_CAPSULE | Freq: Every evening | ORAL | Status: DC | PRN
  Administered 2018-02-08 – 2018-02-09 (×2): 50 mg via ORAL
  Filled 2018-02-08 (×2): qty 2

## 2018-02-08 NOTE — Anesthesia Postprocedure Evaluation (Signed)
Anesthesia Post Note  Patient: Margaret Guzman  Procedure(s) Performed: CYSTOSCOPY WITH URETEROSCOPY AND STENT PLACEMENT (Left )  Patient location during evaluation: PACU Anesthesia Type: General Level of consciousness: awake and alert and oriented Pain management: pain level controlled Vital Signs Assessment: post-procedure vital signs reviewed and stable Respiratory status: spontaneous breathing Cardiovascular status: blood pressure returned to baseline Anesthetic complications: no     Last Vitals:  Vitals:   02/07/18 1925 02/08/18 0604  BP: 103/61 104/62  Pulse: (!) 108 85  Resp: 17 17  Temp: (!) 38.3 C 36.7 C  SpO2: 95% 99%    Last Pain:  Vitals:   02/08/18 0604  TempSrc: Oral  PainSc:                  Anniah Glick

## 2018-02-08 NOTE — Progress Notes (Signed)
[facility] [place] [facility]   PATIENT NAME: Margaret Guzman    MR#:  [id number]  DATE OF BIRTH:  [birth year]  SUBJECTIVE:   Patient doing well this morning.  Had no fever this morning. Denies urinary symptoms  REVIEW OF SYSTEMS:    Review of Systems  Constitutional: Negative for fever, chills weight loss HENT: Negative for ear pain, nosebleeds, congestion, facial swelling, rhinorrhea, neck pain, neck stiffness and ear discharge.   Respiratory: Negative for cough, shortness of breath, wheezing  Cardiovascular: Negative for chest pain, palpitations and leg swelling.  Gastrointestinal: Negative for heartburn, abdominal pain, vomiting, diarrhea or consitpation Genitourinary: Negative for dysuria, urgency, frequency, hematuria Musculoskeletal: Negative for back pain or joint pain Neurological: Negative for dizziness, seizures, syncope, focal weakness,  numbness and headaches.  Hematological: Does not bruise/bleed easily.  Psychiatric/Behavioral: Negative for hallucinations, confusion, dysphoric mood    Tolerating Diet: yes      DRUG ALLERGIES:   Allergies  Allergen Reactions  . Augmentin [Amoxicillin-Pot Clavulanate]     VITALS:  Blood pressure 104/62, pulse 85, temperature 98.1 F (36.7 C), temperature source Oral, resp. rate 17, height 5' 5.5" (1.664 m), weight 83.2 kg, last menstrual period 01/29/2018, SpO2 99 %.  PHYSICAL EXAMINATION:  Constitutional: Appears well-developed and well-nourished. No distress. HENT: Normocephalic. Marland Kitchen Oropharynx is clear and moist.  Eyes: Conjunctivae and EOM are normal. PERRLA, no scleral icterus.  Neck: Normal ROM. Neck supple. No JVD. No tracheal deviation. CVS: RRR, S1/S2 +, no murmurs, no gallops, no carotid bruit.  Pulmonary: Effort and breath sounds normal, no stridor, rhonchi, wheezes, rales.  Abdominal: Soft. BS +,  no distension, tenderness, rebound or guarding.  Musculoskeletal: Normal range of motion.  No edema and no tenderness.  Neuro: Alert. CN 2-12 grossly intact. No focal deficits. Skin: Skin is warm and dry. No rash noted. Psychiatric: Normal mood and affect.      LABORATORY PANEL:   CBC Recent Labs  Lab 02/08/18 0407  WBC 11.6*  HGB 11.5*  HCT 35.6*  PLT 214   ------------------------------------------------------------------------------------------------------------------  Chemistries  Recent Labs  Lab 02/07/18 1414 02/08/18 0407  NA 136 141  K 4.1 3.9  CL 103 107  CO2 26 25  GLUCOSE 173* 173*  BUN 17 13  CREATININE 0.86 0.74  CALCIUM 8.4* 8.4*  AST 55*  --   ALT 47*  --   ALKPHOS 70  --   BILITOT 1.1  --    ------------------------------------------------------------------------------------------------------------------  Cardiac Enzymes No results for input(s): TROPONINI in the last 168 hours. ------------------------------------------------------------------------------------------------------------------  RADIOLOGY:  Ct Renal Stone Study  Result Date: 02/07/2018 CLINICAL DATA:  Initial evaluation for acute left-sided flank pain, urinary urgency. EXAM: CT ABDOMEN AND PELVIS WITHOUT CONTRAST TECHNIQUE: Multidetector CT imaging of the abdomen and pelvis was performed following the standard protocol without IV contrast. COMPARISON:  None available. FINDINGS: Lower chest: Visualized lung bases are clear. Hepatobiliary: Limited noncontrast evaluation of the liver is unremarkable. Gallbladder surgically absent. No biliary dilatation. Pancreas: Pancreas within normal limits. Spleen: Spleen within normal limits. Adrenals/Urinary Tract: Adrenal glands are normal. Right kidney unremarkable without nephrolithiasis or hydronephrosis. No radiopaque calculi seen along the course of the right renal collecting system. No right-sided hydroureter. On the left, there is an obstructive 6 mm stone position within the distal left ureter with secondary mild to moderate left  hydroureteronephrosis. Associated perinephric and periureteral fat stranding. Additional 2 adjacent nonobstructive calculi present within the interpolar left kidney, largest of which measures 6 mm.  No other left-sided ureteral stones. Bladder largely decompressed without acute abnormality. No layering stones within the bladder lumen. Stomach/Bowel: Sequelae of prior gastric bypass. No evidence for bowel obstruction. No acute inflammatory changes seen about the bowels. Moderate retained stool within the colon. Vascular/Lymphatic: Intra-abdominal aorta of normal caliber. No pathologically enlarged intra-abdominal or pelvic lymph nodes. Reproductive: Uterus within normal limits. 3 cm simple left ovarian cyst, most consistent with a benign physiologic follicular cyst. Ovaries otherwise unremarkable. Other: Small volume free fluid within the pelvic cul-de-sac, presumably physiologic. No free intraperitoneal air. Musculoskeletal: No acute osseous abnormality. No discrete lytic or blastic osseous lesions. IMPRESSION: 1. 6 mm obstructive stone within the distal left ureter with secondary mild to moderate left hydroureteronephrosis. 2. Additional nonobstructive left renal nephrolithiasis as above. 3. Moderate retained stool diffusely throughout the colon, suggesting constipation. 4. Sequelae of prior gastric bypass and cholecystectomy. Electronically Signed   By: Rise MuBenjamin  McClintock M.D.   On: 02/07/2018 16:08     ASSESSMENT AND PLAN:   52 year old female with history of OSA and hypertension who presented to the emergency room due to left lower quadrant abdominal pain.  1.  Sepsis: Presented with leukocytosis, fever and tachycardia.  Sepsis due to left ureteral stone and UTI. Sepsis is resolved.  2.  Left ureteral stone 6 mm with obstruction: Patient was taken emergently to the OR.  For left ureteral stent placement. Foley may be removed tomorrow if patient remains afebrile. Continue broad-spectrum antibiotics  until culture and sensitivities are back. She will need 14 days of therapy. She will outpatient follow-up with urology in 2 to 3 weeks for management with left ureteroscopy and lithotripsy.  3.  Urinary tract infection: Management as stated above  4.  OSA: Continue CPAP  5.  Hypertension: Blood pressure still low normal and therefore blood pressure medication are on hold.      Management plans discussed with the patient and she is in agreement.  CODE STATUS: full  TOTAL TIME TAKING CARE OF THIS PATIENT: 30 minutes.     POSSIBLE D/C tomorrow, DEPENDING ON CLINICAL CONDITION.   Kelsei Defino M.D on 02/08/2018 at 10:47 AM  Between 7am to 6pm - Pager - [phone] After 6pm go to [email or web address] - password [email or web address]  [facility]  Office  [phone]  CC: Primary care physician; Patient, No Pcp Per  Note: This dictation was prepared with Dragon dictation along with smaller phrase technology. Any transcriptional errors that result from this process are unintentional.

## 2018-02-08 NOTE — Progress Notes (Signed)
Patient continues to run fevers.  Dr Richardo HanksSninsky notified and he ordering a one time dose of gentamycin

## 2018-02-08 NOTE — Progress Notes (Signed)
   Subjective Afebrile overnight, left flank pain completely resolved Feels significantly improved from admission  Physical Exam: BP 104/62 (BP Location: Right Arm)   Pulse 85   Temp 98.1 F (36.7 C) (Oral)   Resp 17   Ht 5' 5.5" (1.664 m)   Wt 83.2 kg   LMP 01/29/2018   SpO2 99%   BMI 30.07 kg/m    Constitutional:  Alert and oriented, No acute distress. Respiratory: Normal respiratory effort, no increased work of breathing. GI: Abdomen is soft, non-tender, non-distended GU: No CVA tenderness Drains: Foley with clear yellow urine  Laboratory Data: Reviewed Leukocytosis improving  Gram stain with gram-negative rods, cultures pending  Assessment & Plan:   Margaret Guzman is a 52 year old female with history of gastric bypass who presented with a 6 mm distal left ureteral stone with hydronephrosis and sepsis from urinary source 02/07/2018.  Postop day 1 left ureteral stent placement.  Recommendations:   -Maintain Foley today, can remove tomorrow morning if remains afebrile -Would continue broad-spectrum antibiotics until cultures and sensitivities return, then discharge on culture appropriate oral therapy for 10 to 14 days. -We will arrange follow-up for definitive management with left ureteroscopy, laser lithotripsy, and stent placement in 2 to 3 weeks after infection treated -Call if questions  Sondra ComeBrian C Dayra Rapley, MD

## 2018-02-09 LAB — HIV ANTIBODY (ROUTINE TESTING W REFLEX): HIV Screen 4th Generation wRfx: NONREACTIVE

## 2018-02-09 MED ORDER — SODIUM CHLORIDE 0.9 % IV SOLN: INTRAVENOUS | Status: DC

## 2018-02-09 NOTE — Progress Notes (Signed)
   Subjective Febrile to 101.6 at 6 PM yesterday, fever curve downtrending Additional dose of gentamicin given last night Denies any flank pain, overall feels well  Physical Exam: BP 108/67 (BP Location: Right Arm)   Pulse 92   Temp 100.3 F (37.9 C) (Oral)   Resp 18   Ht 5' 5.5" (1.664 m)   Wt 87 kg   LMP 01/29/2018   SpO2 97%   BMI 31.43 kg/m    Constitutional:  Alert and oriented, No acute distress. Respiratory: Normal respiratory effort, no increased work of breathing. GI: Abdomen is soft, non-tender, non-distended GU: No CVA tenderness Drains: Urine clear per Foley catheter  Laboratory Data: Gram stain with gram-negative rods, culture pending  Assessment & Plan:   Margaret Guzman is a 52 year old female with history of gastric bypass who presented with a 6 mm distal left ureteral stone with hydronephrosis and sepsis from urinary source 02/07/2018.  Post-op day 2 left ureteral stent placement.  Gram stain with gram-negative rods, cultures pending.  Recommendations:   -Maintain Foley, would recommend maintaining until afebrile <101.3 for 24 hours -Would continue broad-spectrum antibiotics until cultures and sensitivities return, then discharge on culture appropriate oral therapy for 10 to 14 days.  Consider broadening antibiotic from ceftriaxone if additional fever over 101.3 today -We will arrange follow-up for definitive management with left ureteroscopy, laser lithotripsy, and stent placement in 2 to 3 weeks after infection treated -Call if questions  Sondra ComeBrian C Kyng Matlock, MD

## 2018-02-09 NOTE — Progress Notes (Signed)
Advanced care plan.  Purpose of the Encounter: CODE STATUS  Parties in Attendance: Patient  Patient's Decision Capacity: Good  Subjective/Patient's story: Presented to the emergency room for flank pain and fever   Objective/Medical story Patient has left ureteral stone with obstruction Has sepsis secondary to UTI unable Needs IV antibiotics, urology consultation Needs urology intervention for stent placement and possible stone extraction   Goals of care determination:  Advance care directives goals of care and treatment plan discussed Patient wants everything done which includes CPR, intubation and procedures   CODE STATUS: Full code   Time spent discussing advanced care planning: 16 minutes

## 2018-02-09 NOTE — Progress Notes (Signed)
[facility] -  at [facility]   PATIENT NAME: Margaret PrimesChristinia Guzman    MR#:  [id number]  DATE OF BIRTH:  13-Dec-1965  SUBJECTIVE:   Seen and evaluated today Had fever last evening around 101 F And last night patient had some low-grade fever No complaints of any flank pain  REVIEW OF SYSTEMS:    Review of Systems  Constitutional: Had fever, no chills weight loss HENT: Negative for ear pain, nosebleeds, congestion, facial swelling, rhinorrhea, neck pain, neck stiffness and ear discharge.   Respiratory: Negative for cough, shortness of breath, wheezing  Cardiovascular: Negative for chest pain, palpitations and leg swelling.  Gastrointestinal: Negative for heartburn, abdominal pain, vomiting, diarrhea or consitpation Genitourinary: Negative for dysuria, urgency, frequency, hematuria No flank pain. Musculoskeletal: Negative for back pain or joint pain Neurological: Negative for dizziness, seizures, syncope, focal weakness,  numbness and headaches.  Hematological: Does not bruise/bleed easily.  Psychiatric/Behavioral: Negative for hallucinations, confusion, dysphoric mood  Tolerating Diet: yes  DRUG ALLERGIES:   Allergies  Allergen Reactions  . Augmentin [Amoxicillin-Pot Clavulanate]     VITALS:  Blood pressure 99/64, pulse 73, temperature 98.3 F (36.8 C), temperature source Oral, resp. rate 18, height 5' 5.5" (1.664 m), weight 87 kg, last menstrual period 01/29/2018, SpO2 99 %.  PHYSICAL EXAMINATION:  Constitutional: Appears well-developed and well-nourished. No distress. HENT: Normocephalic. Marland Kitchen. Oropharynx is clear and moist.  Eyes: Conjunctivae and EOM are normal. PERRLA, no scleral icterus.  Neck: Normal ROM. Neck supple. No JVD. No tracheal deviation. CVS: RRR, S1/S2 +, no murmurs, no gallops, no carotid bruit.  Pulmonary: Effort and breath sounds normal, no stridor, rhonchi, wheezes, rales.  Abdominal: Soft. BS +,  no distension, tenderness, rebound  or guarding.  Genitourinary : has Foley catheter Musculoskeletal: Normal range of motion. No edema and no tenderness.  Neuro: Alert. CN 2-12 grossly intact. No focal deficits. Skin: Skin is warm and dry. No rash noted. Psychiatric: Normal mood and affect.      LABORATORY PANEL:   CBC Recent Labs  Lab 02/08/18 0407  WBC 11.6*  HGB 11.5*  HCT 35.6*  PLT 214   ------------------------------------------------------------------------------------------------------------------  Chemistries  Recent Labs  Lab 02/07/18 1414 02/08/18 0407  NA 136 141  K 4.1 3.9  CL 103 107  CO2 26 25  GLUCOSE 173* 173*  BUN 17 13  CREATININE 0.86 0.74  CALCIUM 8.4* 8.4*  AST 55*  --   ALT 47*  --   ALKPHOS 70  --   BILITOT 1.1  --    ------------------------------------------------------------------------------------------------------------------  Cardiac Enzymes No results for input(s): TROPONINI in the last 168 hours. ------------------------------------------------------------------------------------------------------------------  RADIOLOGY:  Ct Renal Stone Study  Result Date: 02/07/2018 CLINICAL DATA:  Initial evaluation for acute left-sided flank pain, urinary urgency. EXAM: CT ABDOMEN AND PELVIS WITHOUT CONTRAST TECHNIQUE: Multidetector CT imaging of the abdomen and pelvis was performed following the standard protocol without IV contrast. COMPARISON:  None available. FINDINGS: Lower chest: Visualized lung bases are clear. Hepatobiliary: Limited noncontrast evaluation of the liver is unremarkable. Gallbladder surgically absent. No biliary dilatation. Pancreas: Pancreas within normal limits. Spleen: Spleen within normal limits. Adrenals/Urinary Tract: Adrenal glands are normal. Right kidney unremarkable without nephrolithiasis or hydronephrosis. No radiopaque calculi seen along the course of the right renal collecting system. No right-sided hydroureter. On the left, there is an obstructive  6 mm stone position within the distal left ureter with secondary mild to moderate left hydroureteronephrosis. Associated perinephric and periureteral fat stranding. Additional 2 adjacent nonobstructive  calculi present within the interpolar left kidney, largest of which measures 6 mm. No other left-sided ureteral stones. Bladder largely decompressed without acute abnormality. No layering stones within the bladder lumen. Stomach/Bowel: Sequelae of prior gastric bypass. No evidence for bowel obstruction. No acute inflammatory changes seen about the bowels. Moderate retained stool within the colon. Vascular/Lymphatic: Intra-abdominal aorta of normal caliber. No pathologically enlarged intra-abdominal or pelvic lymph nodes. Reproductive: Uterus within normal limits. 3 cm simple left ovarian cyst, most consistent with a benign physiologic follicular cyst. Ovaries otherwise unremarkable. Other: Small volume free fluid within the pelvic cul-de-sac, presumably physiologic. No free intraperitoneal air. Musculoskeletal: No acute osseous abnormality. No discrete lytic or blastic osseous lesions. IMPRESSION: 1. 6 mm obstructive stone within the distal left ureter with secondary mild to moderate left hydroureteronephrosis. 2. Additional nonobstructive left renal nephrolithiasis as above. 3. Moderate retained stool diffusely throughout the colon, suggesting constipation. 4. Sequelae of prior gastric bypass and cholecystectomy. Electronically Signed   By: Rise MuBenjamin  McClintock M.D.   On: 02/07/2018 16:08     ASSESSMENT AND PLAN:   52 year old female with history of OSA and hypertension who presented to the emergency room due to left lower quadrant abdominal pain.  Patient has a left ureteral stone with obstruction and needed emergent ureteral stent placement by urology.  1.  Sepsis: Presented with leukocytosis, fever and tachycardia.  Sepsis due to left ureteral stone and UTI. Sepsis has improved with IV antibiotics and  ureteral stent placement.  2.  Left ureteral stone 6 mm with obstruction: Status post ureteral stent placement As patient still has fever Foley catheter will be in place as per urology recommendations Continue IV Rocephin antibiotic until culture and sensitivities are back. She will need 14 days of antibiotic therapy. She will outpatient follow-up with urology in 2 to 3 weeks for management with left ureteroscopy and lithotripsy.  3.  Urinary tract infection: Continue IV Rocephin antibiotic Follow-up cultures and sensitivities  4.  OSA: Continue CPAP  5.  Hypertension: Blood pressure still low normal and therefore blood pressure medication are on hold.  Management plans discussed with the patient and she is in agreement.  CODE STATUS: full  TOTAL TIME TAKING CARE OF THIS PATIENT: 34 minutes.     POSSIBLE D/C tomorrow, DEPENDING ON CLINICAL CONDITION.   Ihor AustinPavan Pyreddy M.D on 02/09/2018 at 1:27 PM  Between 7am to 6pm - Pager - [phone] After 6pm go to [email or web address] - password [email or web address]  [facility]  Office  [phone]  CC: Primary care physician; Patient, No Pcp Per  Note: This dictation was prepared with Dragon dictation along with smaller phrase technology. Any transcriptional errors that result from this process are unintentional.

## 2018-02-10 LAB — CBC
HCT: 32.4 % — ABNORMAL LOW (ref 36.0–46.0)
Hemoglobin: 10.3 g/dL — ABNORMAL LOW (ref 12.0–15.0)
MCH: 29 pg (ref 26.0–34.0)
MCHC: 31.8 g/dL (ref 30.0–36.0)
MCV: 91.3 fL (ref 80.0–100.0)
Platelets: 193 K/uL (ref 150–400)
RBC: 3.55 MIL/uL — ABNORMAL LOW (ref 3.87–5.11)
RDW: 12.1 % (ref 11.5–15.5)
WBC: 5.3 K/uL (ref 4.0–10.5)
nRBC: 0 % (ref 0.0–0.2)

## 2018-02-10 MED ORDER — HYDROCODONE-ACETAMINOPHEN 5-325 MG PO TABS: 1.0000 | ORAL_TABLET | Freq: Four times a day (QID) | ORAL | 0 refills | Status: DC | PRN

## 2018-02-10 MED ORDER — CIPROFLOXACIN HCL 500 MG PO TABS: 500.0000 mg | ORAL_TABLET | Freq: Two times a day (BID) | ORAL | 0 refills | Status: AC

## 2018-02-10 NOTE — Progress Notes (Signed)
Discharge instructions reviewed with the patient.  Patient did void after the foley was removed.  She is being sent out via wheelchair to her waiting ride

## 2018-02-10 NOTE — Progress Notes (Signed)
At bedside patient resting, denies needs of 4P's. 

## 2018-02-10 NOTE — Discharge Summary (Signed)
[facility] - [place] at [facility]   PATIENT NAME: Margaret Guzman    MR#:  [id number]  DATE OF BIRTH:  [birth year]  DATE OF ADMISSION:  02/07/2018 ADMITTING PHYSICIAN: Milagros Loll, MD  DATE OF DISCHARGE: 12/292/2019  PRIMARY CARE PHYSICIAN: Patient, No Pcp Per   ADMISSION DIAGNOSIS:  Ureterolithiasis [N20.1] Sepsis without acute organ dysfunction, due to unspecified organism (HCC) [A41.9] Ureteral stone with obstruction UTI with sepsis DISCHARGE DIAGNOSIS:  Left ureteral stone with obstruction Urinary tract infection with sepsis Hypertension  SECONDARY DIAGNOSIS:   Past Medical History:  Diagnosis Date  . Hypertension      ADMITTING HISTORY Margaret Guzman  is a 52 y.o. 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  Was seen at [facility] walk-in clinic and referred to the emergency room.  Here patient has been found to have sepsis with fever of 103, tachycardia and CT scan of the abdomen and pelvis showing left ureteral 6 mm stone with obstruction.  UTI on urine analysis.  Urology contacted and patient is being admitted to the hospital.  Last time she had anything to eat or drink was 8:30 AM. Complains of dysuria but no hematuria.  HOSPITAL COURSE:  Patient was admitted to medical floor.  Urology consultation was done, patient had a left ureteral stent placement along with Foley catheter.  Patient received IV fluids and initially started on broad-spectrum antibiotic which was tapered to IV Rocephin antibiotic.  Her flank pain improved fever resolved tachycardia resolved.  Outpatient ureteroscopy and lithotripsy will be planned with urology service after 2 weeks.  Patient will be discharged on oral ciprofloxacin antibiotic and Foley catheter will be discontinued.  Tolerated oral diet.  CONSULTS OBTAINED:  Treatment Team:  Sondra Come, MD  DRUG  ALLERGIES:   Allergies  Allergen Reactions  . Augmentin [Amoxicillin-Pot Clavulanate]     DISCHARGE MEDICATIONS:   Allergies as of 02/10/2018      Reactions   Augmentin [amoxicillin-pot Clavulanate]       Medication List    STOP taking these medications   CALCIUM CITRATE+D3 PETITES 200-250 MG-UNIT Tabs Generic drug:  Calcium Citrate-Vitamin D   erythromycin ophthalmic ointment   glimepiride 2 MG tablet Commonly known as:  AMARYL   INVOKANA 100 MG Tabs tablet Generic drug:  canagliflozin   metFORMIN 500 MG 24 hr tablet Commonly known as:  GLUCOPHAGE-XR   NEXPLANON 68 MG Impl implant Generic drug:  etonogestrel   pantoprazole 40 MG tablet Commonly known as:  PROTONIX   RABEprazole 20 MG tablet Commonly known as:  ACIPHEX   triamcinolone cream 0.1 % Commonly known as:  KENALOG   valACYclovir 1000 MG tablet Commonly known as:  VALTREX   valsartan 320 MG tablet Commonly known as:  DIOVAN     TAKE these medications   ciprofloxacin 500 MG tablet Commonly known as:  CIPRO Take 1 tablet (500 mg total) by mouth 2 (two) times daily for 11 days.   HYDROcodone-acetaminophen 5-325 MG tablet Commonly known as:  NORCO/VICODIN Take 1-2 tablets by mouth every 6 (six) hours as needed for moderate pain.   olmesartan 20 MG tablet Commonly known as:  BENICAR Take 1 tablet by mouth daily. What changed:  Another medication with the same name was removed. Continue taking this medication, and follow the directions you see here.   venlafaxine XR 150 MG 24 hr capsule Commonly known as:  EFFEXOR-XR Take 1 capsule by mouth daily. What changed:  Another medication with the same name was removed. Continue taking this medication, and follow the directions you see here.       Today  Patient seen today Fevers have resolved No flank pain No nausea vomiting Tolerating diet well Hemodynamically stable  VITAL SIGNS:  Blood pressure 104/71, pulse 77, temperature 98.6 F (37  C), temperature source Oral, resp. rate 16, height 5' 5.5" (1.664 m), weight 87.2 kg, last menstrual period 01/29/2018, SpO2 97 %.  I/O:    Intake/Output Summary (Last 24 hours) at 02/10/2018 1213 Last data filed at 02/10/2018 1109 Gross per 24 hour  Intake 267.75 ml  Output 1000 ml  Net -732.25 ml    PHYSICAL EXAMINATION:  Physical Exam  GENERAL:  52 y.o.-year-old patient lying in the bed with no acute distress.  LUNGS: Normal breath sounds bilaterally, no wheezing, rales,rhonchi or crepitation. No use of accessory muscles of respiration.  CARDIOVASCULAR: S1, S2 normal. No murmurs, rubs, or gallops.  ABDOMEN: Soft, non-tender, non-distended. Bowel sounds present. No organomegaly or mass.  NEUROLOGIC: Moves all 4 extremities. PSYCHIATRIC: The patient is alert and oriented x 3.  SKIN: No obvious rash, lesion, or ulcer.   DATA REVIEW:   CBC Recent Labs  Lab 02/10/18 0348  WBC 5.3  HGB 10.3*  HCT 32.4*  PLT 193    Chemistries  Recent Labs  Lab 02/07/18 1414 02/08/18 0407  NA 136 141  K 4.1 3.9  CL 103 107  CO2 26 25  GLUCOSE 173* 173*  BUN 17 13  CREATININE 0.86 0.74  CALCIUM 8.4* 8.4*  AST 55*  --   ALT 47*  --   ALKPHOS 70  --   BILITOT 1.1  --     Cardiac Enzymes No results for input(s): TROPONINI in the last 168 hours.  Microbiology Results  Results for orders placed or performed during the hospital encounter of 02/07/18  Aerobic/Anaerobic Culture (surgical/deep wound)     Status: None (Preliminary result)   Collection Time: 02/07/18  5:26 PM  Result Value Ref Range Status   Specimen Description   Final    FLUID LEFT RENAL ASPIRATE Performed at [facility], [address] [place], [place] [address]    Special Requests   Final    NONE Performed at [facility], [address] [place], [place] [address]    Gram Stain   Final    ABUNDANT WBC PRESENT, PREDOMINANTLY PMN MODERATE GRAM NEGATIVE RODS    Culture   Final     FEW ESCHERICHIA COLI SUSCEPTIBILITIES TO FOLLOW Performed at [facility], [address] [place], [place] [address]    Report Status PENDING  Incomplete    RADIOLOGY:  No results found.  Follow up with PCP in 1 week.  Management plans discussed with the patient, family and they are in agreement.  CODE STATUS: Full code    Code Status Orders  (From admission, onward)         Start     Ordered   02/07/18 1635  Full code  Continuous     02/07/18 1635        Code Status History    This patient has a current code status but no historical code status.      TOTAL TIME TAKING CARE OF THIS PATIENT ON DAY OF DISCHARGE: more than 34 minutes.   Ihor AustinPavan Pyreddy M.D on 02/10/2018 at 12:13 PM  Between  7am to 6pm - Pager - [phone]  After 6pm go to [email or web address] - password [email or web address]  [facility] [place] [facility]  Office  [phone]  CC: Primary care physician; Patient, No Pcp Per  Note: This dictation was prepared with Dragon dictation along with smaller phrase technology. Any transcriptional errors that result from this process are unintentional.

## 2018-02-11 ENCOUNTER — Telehealth: Payer: Self-pay | Admitting: Radiology

## 2018-02-11 ENCOUNTER — Other Ambulatory Visit: Payer: Self-pay | Admitting: Radiology

## 2018-02-11 DIAGNOSIS — N201 Calculus of ureter: Secondary | ICD-10-CM

## 2018-02-11 NOTE — Telephone Encounter (Signed)
LMOM to return call. Need to cancel office visit with Dr Richardo HanksSninsky scheduled 02/25/2018 & schedule surgery instead.

## 2018-02-11 NOTE — Telephone Encounter (Signed)
Discussed the [facility] Surgery Information form below with the patient over the phone.  [address] [facility], [address] [place], [place] [address] Telephone: [phone] Fax: [phone]   Thank you for choosing [facility] for your upcoming surgery!  We are always here to assist in your urological needs.  Please read the following information with specific details for your upcoming appointments related to your surgery. Please contact Taneisha Fuson at [phone] Option 3 with any questions.  The Name of Your Surgery: Left ureteroscopy, laser lithotripsy, stone removal, left ureteral stent exchange Your Surgery Date: 03/01/2018 Your Surgeon: Legrand RamsBrian Sninsky  Please call Same Day Surgery at [phone] between the hours of 1pm-3pm one day prior to your surgery. They will inform you of the time to arrive at Same Day Surgery which is located on the second floor of the [facility].  You will receive a call from the Pre-Admission Testing office regarding your appointment with them.  The Pre-Admission Testing office is located at [address], on the first floor of the [facility] Arts Center at [facility] in [address] (office is to the right as you enter through the [facility] the [facility]). Please have all medications you are currently taking and your insurance card available.   Patient was advised to have nothing to eat or drink after midnight the night prior to surgery except that she may have only water until 2 hours before surgery with nothing to drink within 2 hours of surgery.  The patient states she currently takes no blood thinners. Patient's questions were answered and she expressed understanding of these instructions.

## 2018-02-13 LAB — AEROBIC/ANAEROBIC CULTURE W GRAM STAIN (SURGICAL/DEEP WOUND)

## 2018-02-13 LAB — AEROBIC/ANAEROBIC CULTURE (SURGICAL/DEEP WOUND)

## 2018-02-21 ENCOUNTER — Other Ambulatory Visit: Payer: Self-pay

## 2018-02-21 ENCOUNTER — Encounter
Admission: RE | Admit: 2018-02-21 | Discharge: 2018-02-21 | Disposition: A | Discharge status: Home / Self Care | Payer: BLUE CROSS/BLUE SHIELD | Source: Ambulatory Visit | Attending: Urology | Admitting: Urology

## 2018-02-21 DIAGNOSIS — Z01818 Encounter for other preprocedural examination: Secondary | ICD-10-CM | POA: Diagnosis present

## 2018-02-21 HISTORY — DX: Gastro-esophageal reflux disease without esophagitis: K21.9

## 2018-02-21 HISTORY — DX: Sleep apnea, unspecified: G47.30

## 2018-02-21 HISTORY — DX: Major depressive disorder, single episode, unspecified: F32.9

## 2018-02-21 HISTORY — DX: Depression, unspecified: F32.A

## 2018-02-21 LAB — URINALYSIS, ROUTINE W REFLEX MICROSCOPIC
Bacteria, UA: NONE SEEN
Bilirubin Urine: NEGATIVE
Glucose, UA: NEGATIVE mg/dL
Ketones, ur: NEGATIVE mg/dL
Nitrite: NEGATIVE
Protein, ur: NEGATIVE mg/dL
RBC / HPF: >50 RBC/hpf — ABNORMAL HIGH (ref 0–5)
Specific Gravity, Urine: 1.014 (ref 1.005–1.030)
pH: 5 (ref 5.0–8.0)

## 2018-02-21 NOTE — Pre-Procedure Instructions (Signed)
EKG OK BY DR KARENZ 

## 2018-02-21 NOTE — Patient Instructions (Addendum)
Your procedure is scheduled on: 03/01/18 Fri Report to Same Day Surgery 2nd floor medical mall [facility] Entrance-take elevator on left to 2nd floor.  Check in with surgery information desk.) To find out your arrival time please call [phone] between 1PM - 3PM on 02/28/18 Thurs  Remember: Instructions that are not followed completely may result in serious medical risk, up to and including death, or upon the discretion of your surgeon and anesthesiologist your surgery may need to be rescheduled.    _x___ 1. Do not eat food after midnight the night before your procedure. You may drink clear liquids up to 2 hours before you are scheduled to arrive at the hospital for your procedure.  Do not drink clear liquids within 2 hours of your scheduled arrival to the hospital.  Clear liquids include  --Water or Apple juice without pulp  --Clear carbohydrate beverage such as ClearFast or Gatorade  --Black Coffee or Clear Tea (No milk, no creamers, do not add anything to                  the coffee or Tea Type 1 and type 2 diabetics should only drink water.   ____Ensure clear carbohydrate drink on the way to the hospital for bariatric patients  ____Ensure clear carbohydrate drink 3 hours before surgery for Dr Rutherford Nail patients if physician instructed.   No gum chewing or hard candies.     __x__ 2. No Alcohol for 24 hours before or after surgery.   __x__3. No Smoking or e-cigarettes for 24 prior to surgery.  Do not use any chewable tobacco products for at least 6 hour prior to surgery   ____  4. Bring all medications with you on the day of surgery if instructed.    __x__ 5. Notify your doctor if there is any change in your medical condition     (cold, fever, infections).    x___6. On the morning of surgery brush your teeth with toothpaste and water.  You may rinse your mouth with mouth wash if you wish.  Do not swallow any toothpaste or mouthwash.   Do not wear jewelry, make-up, hairpins,  clips or nail polish.  Do not wear lotions, powders, or perfumes. You may wear deodorant.  Do not shave 48 hours prior to surgery. Men may shave face and neck.  Do not bring valuables to the hospital.    [facility] is not responsible for any belongings or valuables.               Contacts, dentures or bridgework may not be worn into surgery.  Leave your suitcase in the car. After surgery it may be brought to your room.  For patients admitted to the hospital, discharge time is determined by your                       treatment team.  _  Patients discharged the day of surgery will not be allowed to drive home.  You will need someone to drive you home and stay with you the night of your procedure.    Please read over the following fact sheets that you were given:   [facility] Preparing for Surgery and or MRSA Information   _x___ Take anti-hypertensive listed below, cardiac, seizure, asthma,     anti-reflux and psychiatric medicines. These include:  1. venlafaxine XR (EFFEXOR-XR) 150 MG 24 hr capsule  2.  3.  4.  5.  6.  ____Fleets enema or Magnesium Citrate as directed.   ____ Use CHG Soap or sage wipes as directed on instruction sheet   ____ Use inhalers on the day of surgery and bring to hospital day of surgery  ____ Stop Metformin and Janumet 2 days prior to surgery.    ____ Take 1/2 of usual insulin dose the night before surgery and none on the morning     surgery.   _x___ Follow recommendations from Cardiologist, Pulmonologist or PCP regarding          stopping Aspirin, Coumadin, Plavix ,Eliquis, Effient, or Pradaxa, and Pletal.  X____Stop Anti-inflammatories such as Advil, Aleve, Ibuprofen, Motrin, Naproxen, Naprosyn, Goodies powders or aspirin products. OK to take Tylenol and                          Celebrex.   _x___ Stop supplements until after surgery.  But may continue Vitamin D, Vitamin B,       and multivitamin.   _x___ Bring C-Pap to the hospital.

## 2018-02-21 NOTE — Pre-Procedure Instructions (Signed)
UA FAXED TO DR Richardo Hanks

## 2018-02-23 LAB — URINE CULTURE: Culture: <10000 — AB

## 2018-02-25 ENCOUNTER — Ambulatory Visit: Payer: Self-pay | Admitting: Urology

## 2018-02-28 MED ORDER — CIPROFLOXACIN IN D5W 400 MG/200ML IV SOLN
400.0000 mg | INTRAVENOUS | Status: AC
  Administered 2018-03-01: 400 mg via INTRAVENOUS

## 2018-03-01 ENCOUNTER — Ambulatory Visit: Payer: BLUE CROSS/BLUE SHIELD | Admitting: Anesthesiology

## 2018-03-01 ENCOUNTER — Encounter
Admission: RE | Disposition: A | Discharge status: Home / Self Care | Payer: Self-pay | Source: Ambulatory Visit | Attending: Urology

## 2018-03-01 ENCOUNTER — Encounter: Payer: Self-pay | Admitting: *Deleted

## 2018-03-01 ENCOUNTER — Ambulatory Visit
Admission: RE | Admit: 2018-03-01 | Discharge: 2018-03-01 | Disposition: A | Discharge status: Home / Self Care | Payer: BLUE CROSS/BLUE SHIELD | Source: Ambulatory Visit | Attending: Urology | Admitting: Urology

## 2018-03-01 ENCOUNTER — Other Ambulatory Visit: Payer: Self-pay

## 2018-03-01 DIAGNOSIS — N201 Calculus of ureter: Secondary | ICD-10-CM

## 2018-03-01 DIAGNOSIS — N202 Calculus of kidney with calculus of ureter: Secondary | ICD-10-CM | POA: Diagnosis present

## 2018-03-01 DIAGNOSIS — Z9049 Acquired absence of other specified parts of digestive tract: Secondary | ICD-10-CM | POA: Insufficient documentation

## 2018-03-01 DIAGNOSIS — Z9884 Bariatric surgery status: Secondary | ICD-10-CM | POA: Diagnosis not present

## 2018-03-01 DIAGNOSIS — K219 Gastro-esophageal reflux disease without esophagitis: Secondary | ICD-10-CM | POA: Insufficient documentation

## 2018-03-01 DIAGNOSIS — I10 Essential (primary) hypertension: Secondary | ICD-10-CM | POA: Diagnosis not present

## 2018-03-01 DIAGNOSIS — F329 Major depressive disorder, single episode, unspecified: Secondary | ICD-10-CM | POA: Insufficient documentation

## 2018-03-01 DIAGNOSIS — G4730 Sleep apnea, unspecified: Secondary | ICD-10-CM | POA: Diagnosis not present

## 2018-03-01 DIAGNOSIS — N200 Calculus of kidney: Secondary | ICD-10-CM | POA: Diagnosis not present

## 2018-03-01 HISTORY — PX: CYSTOSCOPY/URETEROSCOPY/HOLMIUM LASER/STENT PLACEMENT: SHX6546

## 2018-03-01 HISTORY — PX: CYSTOSCOPY/RETROGRADE/URETEROSCOPY: SHX5316

## 2018-03-01 SURGERY — CYSTOSCOPY/URETEROSCOPY/HOLMIUM LASER/STENT PLACEMENT
Anesthesia: General | Laterality: Left

## 2018-03-01 MED ORDER — MIDAZOLAM HCL 2 MG/2ML IJ SOLN
INTRAMUSCULAR | Status: AC
  Filled 2018-03-01: qty 2

## 2018-03-01 MED ORDER — TAMSULOSIN HCL 0.4 MG PO CAPS: 0.4000 mg | ORAL_CAPSULE | Freq: Every day | ORAL | 0 refills | Status: DC

## 2018-03-01 MED ORDER — FENTANYL CITRATE (PF) 100 MCG/2ML IJ SOLN
INTRAMUSCULAR | Status: AC
  Filled 2018-03-01: qty 2

## 2018-03-01 MED ORDER — IOPAMIDOL (ISOVUE-M 200) INJECTION 41%
INTRAMUSCULAR | Status: DC | PRN
  Administered 2018-03-01: 10 mL

## 2018-03-01 MED ORDER — FENTANYL CITRATE (PF) 100 MCG/2ML IJ SOLN: 25.0000 ug | INTRAMUSCULAR | Status: DC | PRN

## 2018-03-01 MED ORDER — FENTANYL CITRATE (PF) 100 MCG/2ML IJ SOLN
INTRAMUSCULAR | Status: DC | PRN
  Administered 2018-03-01: 100 ug via INTRAVENOUS

## 2018-03-01 MED ORDER — LIDOCAINE HCL (CARDIAC) PF 100 MG/5ML IV SOSY
PREFILLED_SYRINGE | INTRAVENOUS | Status: DC | PRN
  Administered 2018-03-01: 60 mg via INTRAVENOUS

## 2018-03-01 MED ORDER — LACTATED RINGERS IV SOLN
INTRAVENOUS | Status: DC
  Administered 2018-03-01: 11:00:00 via INTRAVENOUS

## 2018-03-01 MED ORDER — ROCURONIUM BROMIDE 100 MG/10ML IV SOLN
INTRAVENOUS | Status: DC | PRN
  Administered 2018-03-01: 40 mg via INTRAVENOUS

## 2018-03-01 MED ORDER — SULFAMETHOXAZOLE-TRIMETHOPRIM 800-160 MG PO TABS: 1.0000 | ORAL_TABLET | Freq: Every day | ORAL | 0 refills | Status: DC

## 2018-03-01 MED ORDER — KETOROLAC TROMETHAMINE 30 MG/ML IJ SOLN
INTRAMUSCULAR | Status: DC | PRN
  Administered 2018-03-01: 15 mg via INTRAVENOUS

## 2018-03-01 MED ORDER — SUCCINYLCHOLINE CHLORIDE 20 MG/ML IJ SOLN
INTRAMUSCULAR | Status: DC | PRN
  Administered 2018-03-01: 120 mg via INTRAVENOUS

## 2018-03-01 MED ORDER — FAMOTIDINE 20 MG PO TABS
20.0000 mg | ORAL_TABLET | Freq: Once | ORAL | Status: AC
  Administered 2018-03-01: 20 mg via ORAL

## 2018-03-01 MED ORDER — PROPOFOL 10 MG/ML IV BOLUS
INTRAVENOUS | Status: AC
  Filled 2018-03-01: qty 20

## 2018-03-01 MED ORDER — SUGAMMADEX SODIUM 500 MG/5ML IV SOLN
INTRAVENOUS | Status: DC | PRN
  Administered 2018-03-01: 350 mg via INTRAVENOUS

## 2018-03-01 MED ORDER — PHENYLEPHRINE HCL 10 MG/ML IJ SOLN
INTRAMUSCULAR | Status: DC | PRN
  Administered 2018-03-01: 100 ug via INTRAVENOUS

## 2018-03-01 MED ORDER — ONDANSETRON HCL 4 MG/2ML IJ SOLN
INTRAMUSCULAR | Status: DC | PRN
  Administered 2018-03-01: 4 mg via INTRAVENOUS

## 2018-03-01 MED ORDER — ROCURONIUM BROMIDE 50 MG/5ML IV SOLN
INTRAVENOUS | Status: AC
  Filled 2018-03-01: qty 1

## 2018-03-01 MED ORDER — ONDANSETRON HCL 4 MG/2ML IJ SOLN
INTRAMUSCULAR | Status: AC
  Filled 2018-03-01: qty 2

## 2018-03-01 MED ORDER — FAMOTIDINE 20 MG PO TABS
ORAL_TABLET | ORAL | Status: AC
  Administered 2018-03-01: 20 mg via ORAL
  Filled 2018-03-01: qty 1

## 2018-03-01 MED ORDER — ONDANSETRON HCL 4 MG/2ML IJ SOLN: 4.0000 mg | Freq: Once | INTRAMUSCULAR | Status: DC | PRN

## 2018-03-01 MED ORDER — CIPROFLOXACIN IN D5W 400 MG/200ML IV SOLN
INTRAVENOUS | Status: AC
  Filled 2018-03-01: qty 200

## 2018-03-01 MED ORDER — PROPOFOL 10 MG/ML IV BOLUS
INTRAVENOUS | Status: DC | PRN
  Administered 2018-03-01: 160 mg via INTRAVENOUS

## 2018-03-01 MED ORDER — LACTATED RINGERS IV SOLN
INTRAVENOUS | Status: DC | PRN
  Administered 2018-03-01: 11:00:00 via INTRAVENOUS

## 2018-03-01 MED ORDER — MIDAZOLAM HCL 2 MG/2ML IJ SOLN
INTRAMUSCULAR | Status: DC | PRN
  Administered 2018-03-01: 2 mg via INTRAVENOUS

## 2018-03-01 SURGICAL SUPPLY — 35 items
BAG DRAIN CYSTO-URO LG1000N (MISCELLANEOUS) ×2 IMPLANT
BRUSH SCRUB EZ 1% IODOPHOR (MISCELLANEOUS) ×2 IMPLANT
BULB IRRIG PATHFIND (MISCELLANEOUS) IMPLANT
CATH URETL 5X70 OPEN END (CATHETERS) ×1 IMPLANT
CNTNR SPEC 2.5X3XGRAD LEK (MISCELLANEOUS)
CONT SPEC 4OZ STER OR WHT (MISCELLANEOUS)
CONT SPEC 4OZ STRL OR WHT (MISCELLANEOUS)
CONTAINER SPEC 2.5X3XGRAD LEK (MISCELLANEOUS) IMPLANT
DRAPE UTILITY 15X26 TOWEL STRL (DRAPES) ×2 IMPLANT
DRSG TEGADERM 2-3/8X2-3/4 SM (GAUZE/BANDAGES/DRESSINGS) ×1 IMPLANT
FIBER LASER LITHO 273 (Laser) ×1 IMPLANT
GLOVE BIOGEL PI IND STRL 7.5 (GLOVE) ×1 IMPLANT
GLOVE BIOGEL PI INDICATOR 7.5 (GLOVE) ×1
GOWN STRL REUS W/ TWL LRG LVL3 (GOWN DISPOSABLE) ×1 IMPLANT
GOWN STRL REUS W/ TWL XL LVL3 (GOWN DISPOSABLE) ×1 IMPLANT
GOWN STRL REUS W/TWL LRG LVL3 (GOWN DISPOSABLE) ×2
GOWN STRL REUS W/TWL XL LVL3 (GOWN DISPOSABLE) ×2
INFUSOR MANOMETER BAG 3000ML (MISCELLANEOUS) ×2 IMPLANT
INTRODUCER DILATOR DOUBLE (INTRODUCER) IMPLANT
KIT TURNOVER CYSTO (KITS) ×2 IMPLANT
PACK CYSTO AR (MISCELLANEOUS) ×2 IMPLANT
SENSORWIRE 0.038 NOT ANGLED (WIRE) ×4
SET CYSTO W/LG BORE CLAMP LF (SET/KITS/TRAYS/PACK) ×2 IMPLANT
SHEATH URETERAL 12FRX35CM (MISCELLANEOUS) IMPLANT
SOL .9 NS 3000ML IRR  AL (IV SOLUTION) ×1
SOL .9 NS 3000ML IRR AL (IV SOLUTION) ×1
SOL .9 NS 3000ML IRR UROMATIC (IV SOLUTION) ×1 IMPLANT
STENT URET 6FRX24 CONTOUR (STENTS) IMPLANT
STENT URET 6FRX26 CONTOUR (STENTS) ×1 IMPLANT
SURGILUBE 2OZ TUBE FLIPTOP (MISCELLANEOUS) ×2 IMPLANT
SYR 10ML LL (SYRINGE) ×2 IMPLANT
TUBING ART PRESS 48 MALE/FEM (TUBING) IMPLANT
VALVE UROSEAL ADJ ENDO (VALVE) ×1 IMPLANT
WATER STERILE IRR 1000ML POUR (IV SOLUTION) ×2 IMPLANT
WIRE SENSOR 0.038 NOT ANGLED (WIRE) ×1 IMPLANT

## 2018-03-01 NOTE — Discharge Instructions (Signed)
Ureteroscopy, Care After °This sheet gives you information about how to care for yourself after your procedure. Your health care provider may also give you more specific instructions. If you have problems or questions, contact your health care provider. °What can I expect after the procedure? °After the procedure, it is common to have: °· A burning sensation when you urinate. °· Blood in your urine. °· Mild discomfort in the bladder area or kidney area when urinating. °· Needing to urinate more often or urgently. °Follow these instructions at home: ° °Medicines °· Take over-the-counter and prescription medicines only as told by your health care provider. °· If you were prescribed an antibiotic medicine, take it as told by your health care provider. Do not stop taking the antibiotic even if you start to feel better. °General instructions °· Do not drive for 24 hours if you were given a medicine to help you relax (sedative) during your procedure. °· To relieve burning, try taking a warm bath or holding a warm washcloth over your groin. °· Drink enough fluid to keep your urine clear or pale yellow. °? Drink two 8-ounce glasses of water every hour for the first 2 hours after you get home. °? Continue to drink water often at home. °· You can eat what you usually do. °· Keep all follow-up visits as told by your health care provider. This is important. °? If you had a tube placed to keep urine flowing (ureteral stent), ask your health care provider when you need to return to have it removed. °Contact a health care provider if: °· You have chills or a fever. °· You have burning pain for longer than 24 hours after the procedure. °· You have blood in your urine for longer than 24 hours after the procedure. °Get help right away if: °· You have large amounts of blood in your urine. °· You have blood clots in your urine. °· You have very bad pain. °· You have chest pain or trouble breathing. °· You are unable to urinate and you  have the feeling of a full bladder. °This information is not intended to replace advice given to you by your health care provider. Make sure you discuss any questions you have with your health care provider. °Document Released: 02/04/2013 Document Revised: 11/16/2015 Document Reviewed: 11/12/2015 °Elsevier Interactive Patient Education © 2019 [employer] ° °AMBULATORY SURGERY  °DISCHARGE INSTRUCTIONS ° ° °1) The drugs that you were given will stay in your system until tomorrow so for the next 24 hours you should not: ° °A) Drive an automobile °B) Make any legal decisions °C) Drink any alcoholic beverage ° ° °2) You may resume regular meals tomorrow.  Today it is better to start with liquids and gradually work up to solid foods. ° °You may eat anything you prefer, but it is better to start with liquids, then soup and crackers, and gradually work up to solid foods. ° ° °3) Please notify your doctor immediately if you have any unusual bleeding, trouble breathing, redness and pain at the surgery site, drainage, fever, or pain not relieved by medication. ° ° ° °4) Additional Instructions: ° ° ° ° ° ° ° °Please contact your physician with any problems or Same Day Surgery at [phone], Monday through Friday 6 am to 4 pm, or [place] [facility] number at [phone]. °

## 2018-03-01 NOTE — H&P (Signed)
UROLOGY H&P UPDATE  Agree with prior H&P dated 02/07/2018. 38mm left distal ureteral stone with prior sepsis, pre-stented 12/26.  Cardiac: RRR Lungs: CTA bilaterally  Laterality: Left Procedure: LEFT URETEROSCOPY, LASER LITHOTRIPSY, STENT PLACEMENT  Urine: Urine culture 1/9 <10k insignificant growth  Informed consent obtained, we specifically discussed the risks of bleeding, infection, post-operative pain, need for additional procedures, and stent related symptoms.  We also discussed with her history of gastric surgery she is high risk for calcium oxalate stones.  We discussed stone prevention strategies including adequate hydration with goal urine output 2.5 L/day, as well as adding calcium to meals, especially high oxalate meals to prevent oxalate absorption.  Sondra Come, MD 03/01/2018

## 2018-03-01 NOTE — Op Note (Signed)
Date of procedure: 03/01/18  Preoperative diagnosis:  1. Left 6 mm distal ureteral stone, renal stones, pre-stented  Postoperative diagnosis:  1. Same  Procedure: 1. Cystoscopy, left ureteroscopy, laser lithotripsy, stent exchange, left retrograde pyelogram with intraoperative interpretation  Surgeon: Legrand Rams, MD  Anesthesia: General  Complications: None  Intraoperative findings:  1.  Uncomplicated dusting of left distal ureteral stone and left renal stones 2.  Uncomplicated left ureteral stent placement on Dangler  EBL: None  Specimens: None  Drains: Left 6FX 26 cm ureteral stent with Dangler  Indication: Margaret Guzman is a 53 y.o. patient with history of sepsis from urinary source with a 6 mm left obstructing ureteral stone.  She underwent emergent left ureteral stent placement on 02/07/2018.  She presents today for definitive management with ureteroscopy.  After reviewing the management options for treatment, they elected to proceed with the above surgical procedure(s). We have discussed the potential benefits and risks of the procedure, side effects of the proposed treatment, the likelihood of the patient achieving the goals of the procedure, and any potential problems that might occur during the procedure or recuperation. Informed consent has been obtained.  Description of procedure:  The patient was taken to the operating room and general anesthesia was induced.  The patient was placed in the dorsal lithotomy position, prepped and draped in the usual sterile fashion, and preoperative antibiotics(Cipro) were administered. A preoperative time-out was performed.   The 21 French rigid cystoscope was used to intubate the urethra.  The left ureteral stent was grasped and pulled to the meatus.  A sensor wire was advanced through the stent up to the collecting system under fluoroscopic vision, and the old stent removed.  The semirigid ureteroscope was advanced alongside the  wire into the distal ureter and we identified a yellow 6 mm ureteral stone.  This was fragmented to dust using the 270 m laser fiber on settings of 0.5 J and 20 Hz.  The fragments were irrigated free from the ureter.  The flexible single-channel ureteroscope was then advanced over the wire into the collecting system and we identified 2 distinct stones in a midpole calyx.  The stones were also fragmented to dust using the laser fiber on after mentioned settings.  Thorough pyeloscopy revealed no residual stone fragments.  Left retrograde pyelogram was performed and showed no filling defects.  The sensor wire was replaced through the scope, and pullback ureteroscopy demonstrated no residual ureteral fragments.   A 6 French by 26 cm ureteral stent was uneventfully placed under fluoroscopic vision over the wire with an excellent curl noted in the renal pelvis as well as in the bladder.  The bladder was drained using the obturator and sheath and this concluded our procedure.  A Tegaderm was used to secure the Dangler of the stent to the inner thigh.  Disposition: Stable to PACU  Plan: Stent removal at home in 5 days on Wednesday, January 22nd Prophylactic Bactrim while stent in place She would like to follow-up locally for metabolic stone prevention in the setting of prior gastric surgery  Legrand Rams, MD

## 2018-03-01 NOTE — Anesthesia Post-op Follow-up Note (Signed)
Anesthesia QCDR form completed.        

## 2018-03-01 NOTE — Anesthesia Procedure Notes (Signed)
Procedure Name: Intubation Date/Time: 03/01/2018 11:29 AM Performed by: Justus Memory, CRNA Pre-anesthesia Checklist: Patient identified, Patient being monitored, Timeout performed, Emergency Drugs available and Suction available Patient Re-evaluated:Patient Re-evaluated prior to induction Oxygen Delivery Method: Circle system utilized Preoxygenation: Pre-oxygenation with 100% oxygen Induction Type: IV induction and Rapid sequence Laryngoscope Size: Mac and 3 Grade View: Grade I Tube type: Oral Tube size: 7.0 mm Number of attempts: 1 Airway Equipment and Method: Stylet Placement Confirmation: ETT inserted through vocal cords under direct vision,  positive ETCO2 and breath sounds checked- equal and bilateral Secured at: 21 cm Tube secured with: Tape Dental Injury: Teeth and Oropharynx as per pre-operative assessment

## 2018-03-01 NOTE — Anesthesia Preprocedure Evaluation (Signed)
Anesthesia Evaluation  Patient identified by MRN, date of birth, ID band Patient awake    Reviewed: Allergy & Precautions, H&P , NPO status , Patient's Chart, lab work & pertinent test results, reviewed documented beta blocker date and time   Airway Mallampati: II  TM Distance: >3 FB Neck ROM: full    Dental  (+) Teeth Intact   Pulmonary sleep apnea and Continuous Positive Airway Pressure Ventilation ,    Pulmonary exam normal        Cardiovascular Exercise Tolerance: Good hypertension, On Medications negative cardio ROS Normal cardiovascular exam Rhythm:regular Rate:Normal     Neuro/Psych PSYCHIATRIC DISORDERS Depression negative neurological ROS     GI/Hepatic Neg liver ROS, GERD  ,  Endo/Other  negative endocrine ROS  Renal/GU negative Renal ROS  negative genitourinary   Musculoskeletal   Abdominal   Peds  Hematology negative hematology ROS (+)   Anesthesia Other Findings Past Medical History: No date: Depression No date: GERD (gastroesophageal reflux disease) No date: Hypertension No date: Sleep apnea Past Surgical History: No date: CESAREAN SECTION No date: CHOLECYSTECTOMY 02/07/2018: CYSTOSCOPY WITH URETEROSCOPY AND STENT PLACEMENT; Left     Comment:  Procedure: CYSTOSCOPY WITH URETEROSCOPY AND STENT               PLACEMENT;  Surgeon: Sondra Come, MD;  Location:               [facility] ORS;  Service: Urology;  Laterality: Left; No date: GASTRIC BYPASS BMI    Body Mass Index:  28.65 kg/m     Reproductive/Obstetrics negative OB ROS                             Anesthesia Physical Anesthesia Plan  ASA: II  Anesthesia Plan: General ETT   Post-op Pain Management:    Induction:   PONV Risk Score and Plan:   Airway Management Planned:   Additional Equipment:   Intra-op Plan:   Post-operative Plan:   Informed Consent: I have reviewed the patients History and  Physical, chart, labs and discussed the procedure including the risks, benefits and alternatives for the proposed anesthesia with the patient or authorized representative who has indicated his/her understanding and acceptance.     Dental Advisory Given  Plan Discussed with: CRNA  Anesthesia Plan Comments:         Anesthesia Quick Evaluation

## 2018-03-01 NOTE — Transfer of Care (Signed)
Immediate Anesthesia Transfer of Care Note  Patient: Margaret Guzman  Procedure(s) Performed: CYSTOSCOPY/URETEROSCOPY/HOLMIUM LASER/STENT Exchange (Left ) CYSTOSCOPY/RETROGRADE/URETEROSCOPY (Left )  Patient Location: PACU  Anesthesia Type:General  Level of Consciousness: sedated  Airway & Oxygen Therapy: Patient Spontanous Breathing  Post-op Assessment: Report given to RN and Post -op Vital signs reviewed and stable  Post vital signs: Reviewed and stable  Last Vitals:  Vitals Value Taken Time  BP 124/82 03/01/2018 12:12 PM  Temp 36.9 C 03/01/2018 12:12 PM  Pulse 86 03/01/2018 12:12 PM  Resp 15 03/01/2018 12:12 PM  SpO2 100 % 03/01/2018 12:12 PM  Vitals shown include unvalidated device data.  Last Pain:  Vitals:   03/01/18 1212  TempSrc:   PainSc: 0-No pain         Complications: No apparent anesthesia complications

## 2018-03-07 ENCOUNTER — Ambulatory Visit (INDEPENDENT_AMBULATORY_CARE_PROVIDER_SITE_OTHER): Payer: BLUE CROSS/BLUE SHIELD | Admitting: Urology

## 2018-03-07 ENCOUNTER — Ambulatory Visit: Payer: Self-pay | Admitting: Urology

## 2018-03-07 ENCOUNTER — Encounter: Payer: Self-pay | Admitting: Urology

## 2018-03-07 VITALS — BP 119/77 | HR 98 | Ht 64.5 in | Wt 180.4 lb

## 2018-03-07 DIAGNOSIS — N201 Calculus of ureter: Secondary | ICD-10-CM | POA: Diagnosis not present

## 2018-03-07 DIAGNOSIS — N200 Calculus of kidney: Secondary | ICD-10-CM

## 2018-03-07 DIAGNOSIS — K219 Gastro-esophageal reflux disease without esophagitis: Secondary | ICD-10-CM | POA: Insufficient documentation

## 2018-03-07 DIAGNOSIS — I10 Essential (primary) hypertension: Secondary | ICD-10-CM | POA: Insufficient documentation

## 2018-03-07 LAB — URINALYSIS, COMPLETE
Bilirubin, UA: NEGATIVE
Glucose, UA: NEGATIVE
Ketones, UA: NEGATIVE
Leukocytes, UA: NEGATIVE
NITRITE UA: NEGATIVE
Protein, UA: NEGATIVE
Specific Gravity, UA: 1.01 (ref 1.005–1.030)
Urobilinogen, Ur: 0.2 mg/dL (ref 0.2–1.0)
pH, UA: 5.5 (ref 5.0–7.5)

## 2018-03-07 LAB — MICROSCOPIC EXAMINATION
RBC, UA: NONE SEEN /hpf (ref 0–2)
WBC, UA: NONE SEEN /hpf (ref 0–5)

## 2018-03-07 NOTE — Progress Notes (Signed)
   03/07/2018 10:56 AM   Medina Manson Passey 1965/02/28 [id number]  Reason for visit: Follow up nephrolithiasis  HPI: I saw Ms. Deanda back in urology clinic to discuss strategies for stone prevention.  She is a 53 year old female with recent gastric bypass who originally presented to the emergency department with sepsis from urinary source on 02/07/2018 and underwent emergent left ureteral stent placement for a 6 mm distal ureteral stone.  On 03/01/2018 she underwent left ureteroscopy with laser lithotripsy of her distal ureteral stone as well as a small renal stone.  Her stent was left on a Dangler, and she removed this yesterday at home.  She denies any complaints including flank pain, fevers, hematuria, dysuria, urgency, or frequency.  We discussed general stone prevention strategies including adequate hydration with goal of producing 2.5 L of urine daily, increasing citric acid intake, increasing calcium intake during high oxalate meals, minimizing animal protein, and decreasing salt intake. Information about dietary recommendations given today.  We specifically discussed her high risk with history of gastric bypass, and the importance of minimizing oxalate, and consuming calcium with high oxalate meals.   RTC 1 year with KUB prior  A total of 10 minutes were spent face-to-face with the patient, greater than 50% was spent in patient education, counseling, and coordination of care regarding nephrolithiasis, gastric bypass risk factors, and stone prevention.   Sondra Come, MD  [facility] [address] [place], [place] [address] [phone]

## 2018-03-07 NOTE — Patient Instructions (Signed)
Dietary Guidelines to Help Prevent Kidney Stones  Kidney stones are deposits of minerals and salts that form inside your kidneys. Your risk of developing kidney stones may be greater depending on your diet, your lifestyle, the medicines you take, and whether you have certain medical conditions. Most people can reduce their chances of developing kidney stones by following the instructions below. Depending on your overall health and the type of kidney stones you tend to develop, your dietitian may give you more specific instructions.  What are tips for following this plan?  Reading food labels   Choose foods with "no salt added" or "low-salt" labels. Limit your sodium intake to less than 1500 mg per day.   Choose foods with calcium for each meal and snack. Try to eat about 300 mg of calcium at each meal. Foods that contain 200-500 mg of calcium per serving include:  ? 8 oz (237 ml) of milk, fortified nondairy milk, and fortified fruit juice.  ? 8 oz (237 ml) of kefir, yogurt, and soy yogurt.  ? 4 oz (118 ml) of tofu.  ? 1 oz of cheese.  ? 1 cup (300 g) of dried figs.  ? 1 cup (91 g) of cooked broccoli.  ? 1-3 oz can of sardines or mackerel.   Most people need 1000 to 1500 mg of calcium each day. Talk to your dietitian about how much calcium is recommended for you.  Shopping   Buy plenty of fresh fruits and vegetables. Most people do not need to avoid fruits and vegetables, even if they contain nutrients that may contribute to kidney stones.   When shopping for convenience foods, choose:  ? Whole pieces of fruit.  ? Premade salads with dressing on the side.  ? Low-fat fruit and yogurt smoothies.   Avoid buying frozen meals or prepared deli foods.   Look for foods with live cultures, such as yogurt and kefir.  Cooking   Do not add salt to food when cooking. Place a salt shaker on the table and allow each person to add his or her own salt to taste.   Use vegetable protein, such as beans, textured vegetable  protein (TVP), or tofu instead of meat in pasta, casseroles, and soups.  Meal planning     Eat less salt, if told by your dietitian. To do this:  ? Avoid eating processed or premade food.  ? Avoid eating fast food.   Eat less animal protein, including cheese, meat, poultry, or fish, if told by your dietitian. To do this:  ? Limit the number of times you have meat, poultry, fish, or cheese each week. Eat a diet free of meat at least 2 days a week.  ? Eat only one serving each day of meat, poultry, fish, or seafood.  ? When you prepare animal protein, cut pieces into small portion sizes. For most meat and fish, one serving is about the size of one deck of cards.   Eat at least 5 servings of fresh fruits and vegetables each day. To do this:  ? Keep fruits and vegetables on hand for snacks.  ? Eat 1 piece of fruit or a handful of berries with breakfast.  ? Have a salad and fruit at lunch.  ? Have two kinds of vegetables at dinner.   Limit foods that are high in a substance called oxalate. These include:  ? Spinach.  ? Rhubarb.  ? Beets.  ? Potato chips and french fries.  ? Nuts.     If you regularly take a diuretic medicine, make sure to eat at least 1-2 fruits or vegetables high in potassium each day. These include:  ? Avocado.  ? Banana.  ? Orange, prune, carrot, or tomato juice.  ? Baked potato.  ? Cabbage.  ? Beans and split peas.  General instructions     Drink enough fluid to keep your urine clear or pale yellow. This is the most important thing you can do.   Talk to your health care provider and dietitian about taking daily supplements. Depending on your health and the cause of your kidney stones, you may be advised:  ? Not to take supplements with vitamin C.  ? To take a calcium supplement.  ? To take a daily probiotic supplement.  ? To take other supplements such as magnesium, fish oil, or vitamin B6.   Take all medicines and supplements as told by your health care provider.   Limit alcohol intake to no  more than 1 drink a day for nonpregnant women and 2 drinks a day for men. One drink equals 12 oz of beer, 5 oz of wine, or 1 oz of hard liquor.   Lose weight if told by your health care provider. Work with your dietitian to find strategies and an eating plan that works best for you.  What foods are not recommended?  Limit your intake of the following foods, or as told by your dietitian. Talk to your dietitian about specific foods you should avoid based on the type of kidney stones and your overall health.  Grains  Breads. Bagels. Rolls. Baked goods. Salted crackers. Cereal. Pasta.  Vegetables  Spinach. Rhubarb. Beets. Canned vegetables. Pickles. Olives.  Meats and other protein foods  Nuts. Nut butters. Large portions of meat, poultry, or fish. Salted or cured meats. Deli meats. Hot dogs. Sausages.  Dairy  Cheese.  Beverages  Regular soft drinks. Regular vegetable juice.  Seasonings and other foods  Seasoning blends with salt. Salad dressings. Canned soups. Soy sauce. Ketchup. Barbecue sauce. Canned pasta sauce. Casseroles. Pizza. Lasagna. Frozen meals. Potato chips. French fries.  Summary   You can reduce your risk of kidney stones by making changes to your diet.   The most important thing you can do is drink enough fluid. You should drink enough fluid to keep your urine clear or pale yellow.   Ask your health care provider or dietitian how much protein from animal sources you should eat each day, and also how much salt and calcium you should have each day.  This information is not intended to replace advice given to you by your health care provider. Make sure you discuss any questions you have with your health care provider.  Document Released: 05/27/2010 Document Revised: 01/11/2016 Document Reviewed: 01/11/2016  Elsevier Interactive Patient Education  2019 [employer] Inc[employer]

## 2018-03-07 NOTE — Anesthesia Postprocedure Evaluation (Signed)
Anesthesia Post Note  Patient: Margaret Guzman  Procedure(s) Performed: CYSTOSCOPY/URETEROSCOPY/HOLMIUM LASER/STENT Exchange (Left ) CYSTOSCOPY/RETROGRADE/URETEROSCOPY (Left )  Patient location during evaluation: PACU Anesthesia Type: General Level of consciousness: awake and alert Pain management: pain level controlled Vital Signs Assessment: post-procedure vital signs reviewed and stable Respiratory status: spontaneous breathing, nonlabored ventilation, respiratory function stable and patient connected to nasal cannula oxygen Cardiovascular status: blood pressure returned to baseline and stable Postop Assessment: no apparent nausea or vomiting Anesthetic complications: no     Last Vitals:  Vitals:   03/01/18 1257 03/01/18 1316  BP: 122/76 (P) 129/75  Pulse: 81 (P) 82  Resp: 16   Temp: 36.7 C   SpO2: 100% (P) 100%    Last Pain:  Vitals:   03/04/18 0816  TempSrc:   PainSc: 0-No pain                 Yevette Edwards

## 2019-03-06 ENCOUNTER — Ambulatory Visit: Payer: BLUE CROSS/BLUE SHIELD | Admitting: Urology

## 2019-03-13 ENCOUNTER — Ambulatory Visit (INDEPENDENT_AMBULATORY_CARE_PROVIDER_SITE_OTHER): Payer: BC Managed Care – PPO | Admitting: Urology

## 2019-03-13 ENCOUNTER — Ambulatory Visit
Admission: RE | Admit: 2019-03-13 | Discharge: 2019-03-13 | Disposition: A | Discharge status: Home / Self Care | Payer: BC Managed Care – PPO | Source: Ambulatory Visit | Attending: Urology | Admitting: Urology

## 2019-03-13 ENCOUNTER — Encounter: Payer: Self-pay | Admitting: Urology

## 2019-03-13 ENCOUNTER — Ambulatory Visit
Admission: RE | Admit: 2019-03-13 | Discharge: 2019-03-13 | Disposition: A | Discharge status: Home / Self Care | Payer: BC Managed Care – PPO | Attending: Urology | Admitting: Urology

## 2019-03-13 ENCOUNTER — Other Ambulatory Visit: Payer: Self-pay

## 2019-03-13 VITALS — BP 116/79 | HR 80 | Ht 64.5 in

## 2019-03-13 DIAGNOSIS — N200 Calculus of kidney: Secondary | ICD-10-CM

## 2019-03-13 NOTE — Progress Notes (Signed)
   03/13/2019 2:27 PM   Margaret Guzman 06/15/1965 [id number]  Reason for visit: Follow up nephrolithiasis  HPI: I saw Margaret Guzman back in urology clinic for follow-up of nephrolithiasis.  She is a 54 year old female with history of gastric bypass surgery who presented on 02/07/2018 with an infected 6 mm left distal ureteral stone and sepsis.  She underwent urgent stent placement at that time, followed by delayed ureteroscopy and treatment of her entire left-sided stone burden.  She denies any stone episodes or flank pain over the last year, and is doing well.  KUB today shows no evidence of radiopaque calculi.  We discussed general stone prevention strategies including adequate hydration with goal of producing 2.5 L of urine daily, increasing citric acid intake, increasing calcium intake during high oxalate meals, minimizing animal protein, and decreasing salt intake.  I again reviewed the importance of taking calcium with high oxalate meals in the setting of her prior gastric bypass.  Information about dietary recommendations given today.   RTC 1 year with KUB  A total of 20 minutes were spent face-to-face with the patient, greater than 50% was spent in patient education, counseling, and coordination of care regarding nephrolithiasis and stone prevention.  Sondra Come, MD  [facility] [address], [address] [place], [place] [address] [phone]

## 2019-03-13 NOTE — Patient Instructions (Signed)
Dietary Guidelines to Help Prevent Kidney Stones Kidney stones are deposits of minerals and salts that form inside your kidneys. Your risk of developing kidney stones may be greater depending on your diet, your lifestyle, the medicines you take, and whether you have certain medical conditions. Most people can reduce their chances of developing kidney stones by following the instructions below. Depending on your overall health and the type of kidney stones you tend to develop, your dietitian may give you more specific instructions. What are tips for following this plan? Reading food labels  Choose foods with "no salt added" or "low-salt" labels. Limit your sodium intake to less than 1500 mg per day.  Choose foods with calcium for each meal and snack. Try to eat about 300 mg of calcium at each meal. Foods that contain 200-500 mg of calcium per serving include: ? 8 oz (237 ml) of milk, fortified nondairy milk, and fortified fruit juice. ? 8 oz (237 ml) of kefir, yogurt, and soy yogurt. ? 4 oz (118 ml) of tofu. ? 1 oz of cheese. ? 1 cup (300 g) of dried figs. ? 1 cup (91 g) of cooked broccoli. ? 1-3 oz can of sardines or mackerel.  Most people need 1000 to 1500 mg of calcium each day. Talk to your dietitian about how much calcium is recommended for you. Shopping  Buy plenty of fresh fruits and vegetables. Most people do not need to avoid fruits and vegetables, even if they contain nutrients that may contribute to kidney stones.  When shopping for convenience foods, choose: ? Whole pieces of fruit. ? Premade salads with dressing on the side. ? Low-fat fruit and yogurt smoothies.  Avoid buying frozen meals or prepared deli foods.  Look for foods with live cultures, such as yogurt and kefir. Cooking  Do not add salt to food when cooking. Place a salt shaker on the table and allow each person to add his or her own salt to taste.  Use vegetable protein, such as beans, textured vegetable  protein (TVP), or tofu instead of meat in pasta, casseroles, and soups. Meal planning   Eat less salt, if told by your dietitian. To do this: ? Avoid eating processed or premade food. ? Avoid eating fast food.  Eat less animal protein, including cheese, meat, poultry, or fish, if told by your dietitian. To do this: ? Limit the number of times you have meat, poultry, fish, or cheese each week. Eat a diet free of meat at least 2 days a week. ? Eat only one serving each day of meat, poultry, fish, or seafood. ? When you prepare animal protein, cut pieces into small portion sizes. For most meat and fish, one serving is about the size of one deck of cards.  Eat at least 5 servings of fresh fruits and vegetables each day. To do this: ? Keep fruits and vegetables on hand for snacks. ? Eat 1 piece of fruit or a handful of berries with breakfast. ? Have a salad and fruit at lunch. ? Have two kinds of vegetables at dinner.  Limit foods that are high in a substance called oxalate. These include: ? Spinach. ? Rhubarb. ? Beets. ? Potato chips and french fries. ? Nuts.  If you regularly take a diuretic medicine, make sure to eat at least 1-2 fruits or vegetables high in potassium each day. These include: ? Avocado. ? Banana. ? Orange, prune, carrot, or tomato juice. ? Baked potato. ? Cabbage. ? Beans and split   peas. General instructions   Drink enough fluid to keep your urine clear or pale yellow. This is the most important thing you can do.  Talk to your health care provider and dietitian about taking daily supplements. Depending on your health and the cause of your kidney stones, you may be advised: ? Not to take supplements with vitamin C. ? To take a calcium supplement. ? To take a daily probiotic supplement. ? To take other supplements such as magnesium, fish oil, or vitamin B6.  Take all medicines and supplements as told by your health care provider.  Limit alcohol intake to no  more than 1 drink a day for nonpregnant women and 2 drinks a day for men. One drink equals 12 oz of beer, 5 oz of wine, or 1 oz of hard liquor.  Lose weight if told by your health care provider. Work with your dietitian to find strategies and an eating plan that works best for you. What foods are not recommended? Limit your intake of the following foods, or as told by your dietitian. Talk to your dietitian about specific foods you should avoid based on the type of kidney stones and your overall health. Grains Breads. Bagels. Rolls. Baked goods. Salted crackers. Cereal. Pasta. Vegetables Spinach. Rhubarb. Beets. Canned vegetables. Pickles. Olives. Meats and other protein foods Nuts. Nut butters. Large portions of meat, poultry, or fish. Salted or cured meats. Deli meats. Hot dogs. Sausages. Dairy Cheese. Beverages Regular soft drinks. Regular vegetable juice. Seasonings and other foods Seasoning blends with salt. Salad dressings. Canned soups. Soy sauce. Ketchup. Barbecue sauce. Canned pasta sauce. Casseroles. Pizza. Lasagna. Frozen meals. Potato chips. French fries. Summary  You can reduce your risk of kidney stones by making changes to your diet.  The most important thing you can do is drink enough fluid. You should drink enough fluid to keep your urine clear or pale yellow.  Ask your health care provider or dietitian how much protein from animal sources you should eat each day, and also how much salt and calcium you should have each day. This information is not intended to replace advice given to you by your health care provider. Make sure you discuss any questions you have with your health care provider. Document Revised: 05/22/2018 Document Reviewed: 01/11/2016 [employer] Patient Education  2020 [employer]  

## 2020-03-15 ENCOUNTER — Ambulatory Visit: Payer: Self-pay | Admitting: Urology

## 2020-03-24 ENCOUNTER — Ambulatory Visit (INDEPENDENT_AMBULATORY_CARE_PROVIDER_SITE_OTHER): Payer: BC Managed Care – PPO | Admitting: Urology

## 2020-03-24 ENCOUNTER — Ambulatory Visit
Admission: RE | Admit: 2020-03-24 | Discharge: 2020-03-24 | Disposition: A | Discharge status: Home / Self Care | Payer: BC Managed Care – PPO | Source: Ambulatory Visit | Attending: Urology | Admitting: Urology

## 2020-03-24 ENCOUNTER — Encounter: Payer: Self-pay | Admitting: Urology

## 2020-03-24 ENCOUNTER — Ambulatory Visit
Admission: RE | Admit: 2020-03-24 | Discharge: 2020-03-24 | Disposition: A | Discharge status: Home / Self Care | Payer: BC Managed Care – PPO | Attending: Urology | Admitting: Urology

## 2020-03-24 ENCOUNTER — Other Ambulatory Visit: Payer: Self-pay

## 2020-03-24 VITALS — BP 111/75 | HR 101 | Ht 66.0 in | Wt 204.0 lb

## 2020-03-24 DIAGNOSIS — N200 Calculus of kidney: Secondary | ICD-10-CM | POA: Insufficient documentation

## 2020-03-24 NOTE — Progress Notes (Signed)
   03/24/2020 2:26 PM   Margaret Guzman 1965/10/18 [id number]  Reason for visit: Follow up nephrolithiasis  HPI: I saw Margaret Guzman back in urology clinic for follow-up of nephrolithiasis.  She is a 55 year old female with history of gastric bypass surgery who presented on 02/07/2018 with an infected 6 mm left distal ureteral stone and sepsis.  She underwent urgent stent placement at that time, followed by ureteroscopy and treatment of her entire left-sided stone burden. There is no right-sided stone burden at that time.   She continues to do well, and denies any stone episodes over the last year. I personally reviewed and interpreted her KUB today that shows no evidence of radiopaque stones. She has been careful to increase calcium intake with high oxalate foods with her history of bypass.  We discussed general stone prevention strategies including adequate hydration with goal of producing 2.5 L of urine daily, increasing citric acid intake, increasing calcium intake during high oxalate meals, minimizing animal protein, and decreasing salt intake.  I again reviewed the importance of taking calcium with high oxalate meals in the setting of her prior gastric bypass.  Information about dietary recommendations given today  RTC 2 years with KUB prior   Sondra Come, MD  [facility] [address] [place], [place] [address] [phone]

## 2020-03-24 NOTE — Patient Instructions (Signed)

## 2021-07-26 IMAGING — CR DG ABDOMEN 1V
2 series · 2 of 2 positions shown · non-contrast
Comparison: Abdominal x-ray dated March 13, 2019.

CLINICAL DATA: History of kidney stones.  Currently asymptomatic.

EXAM:
ABDOMEN - 1 VIEW

[abdomen kub (1 of 2)]
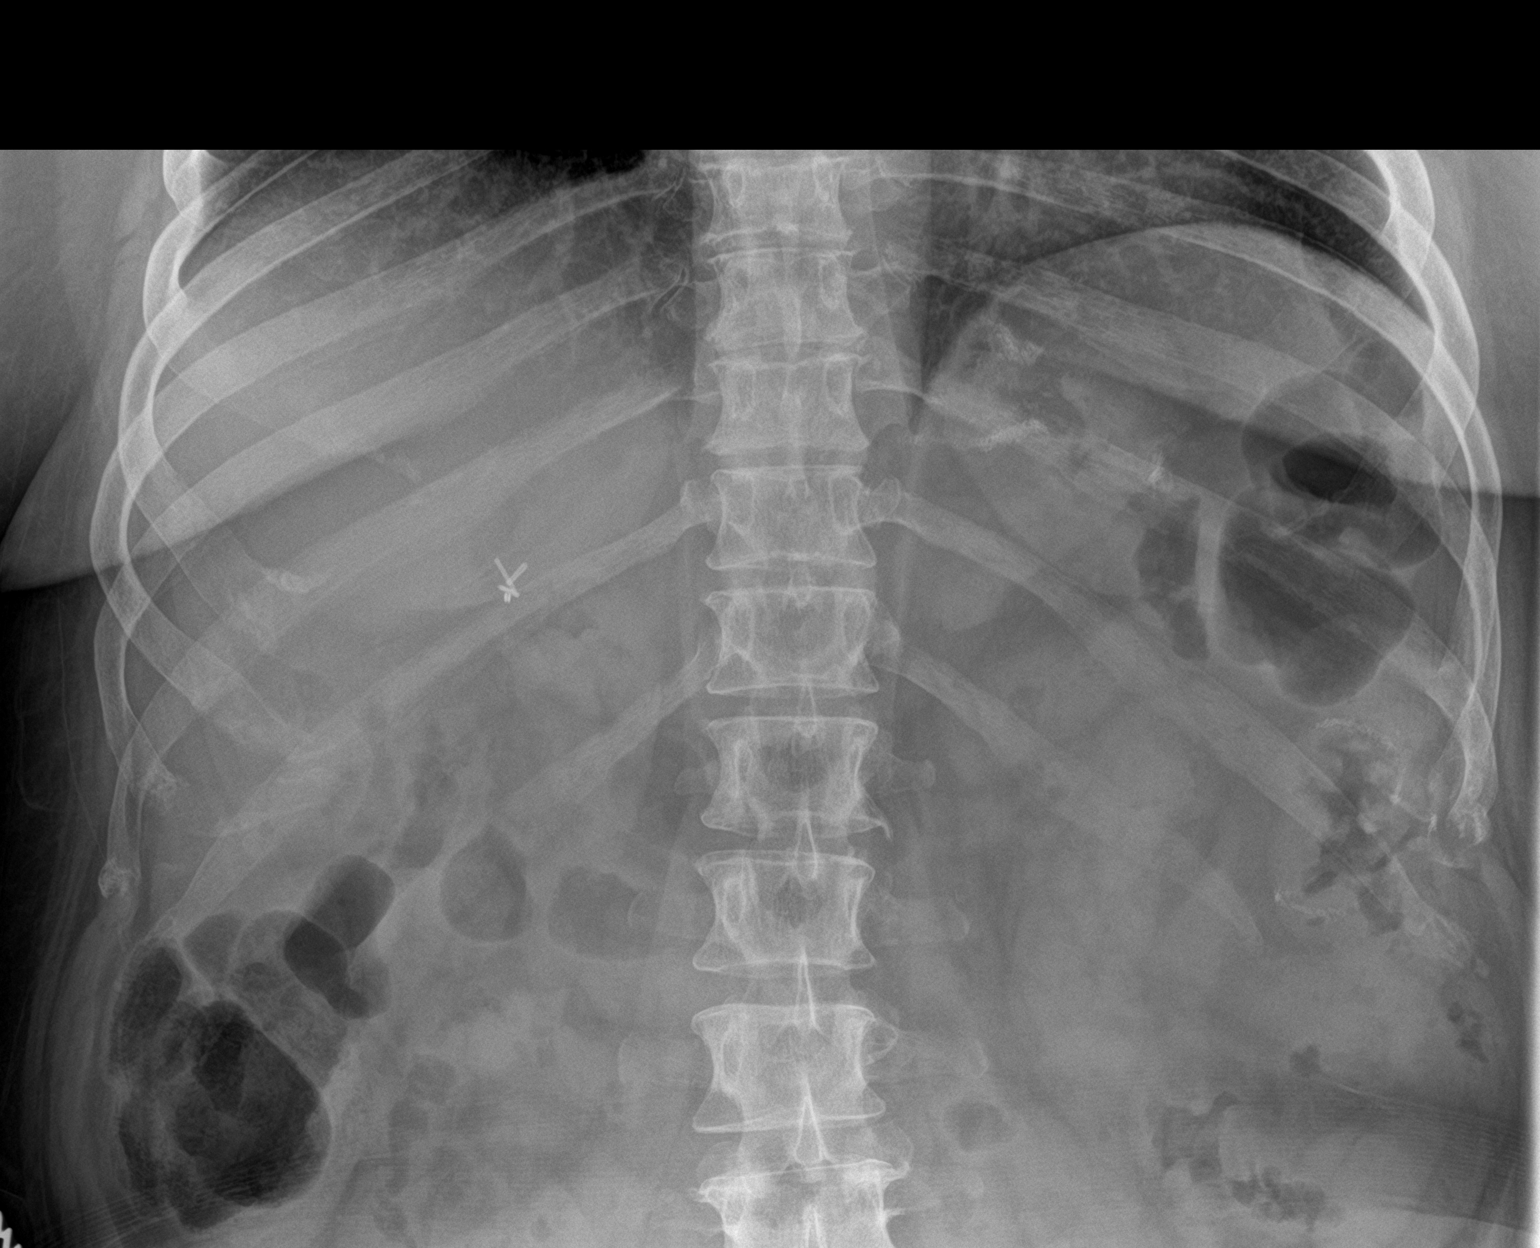

[abdomen kub (2 of 2)]
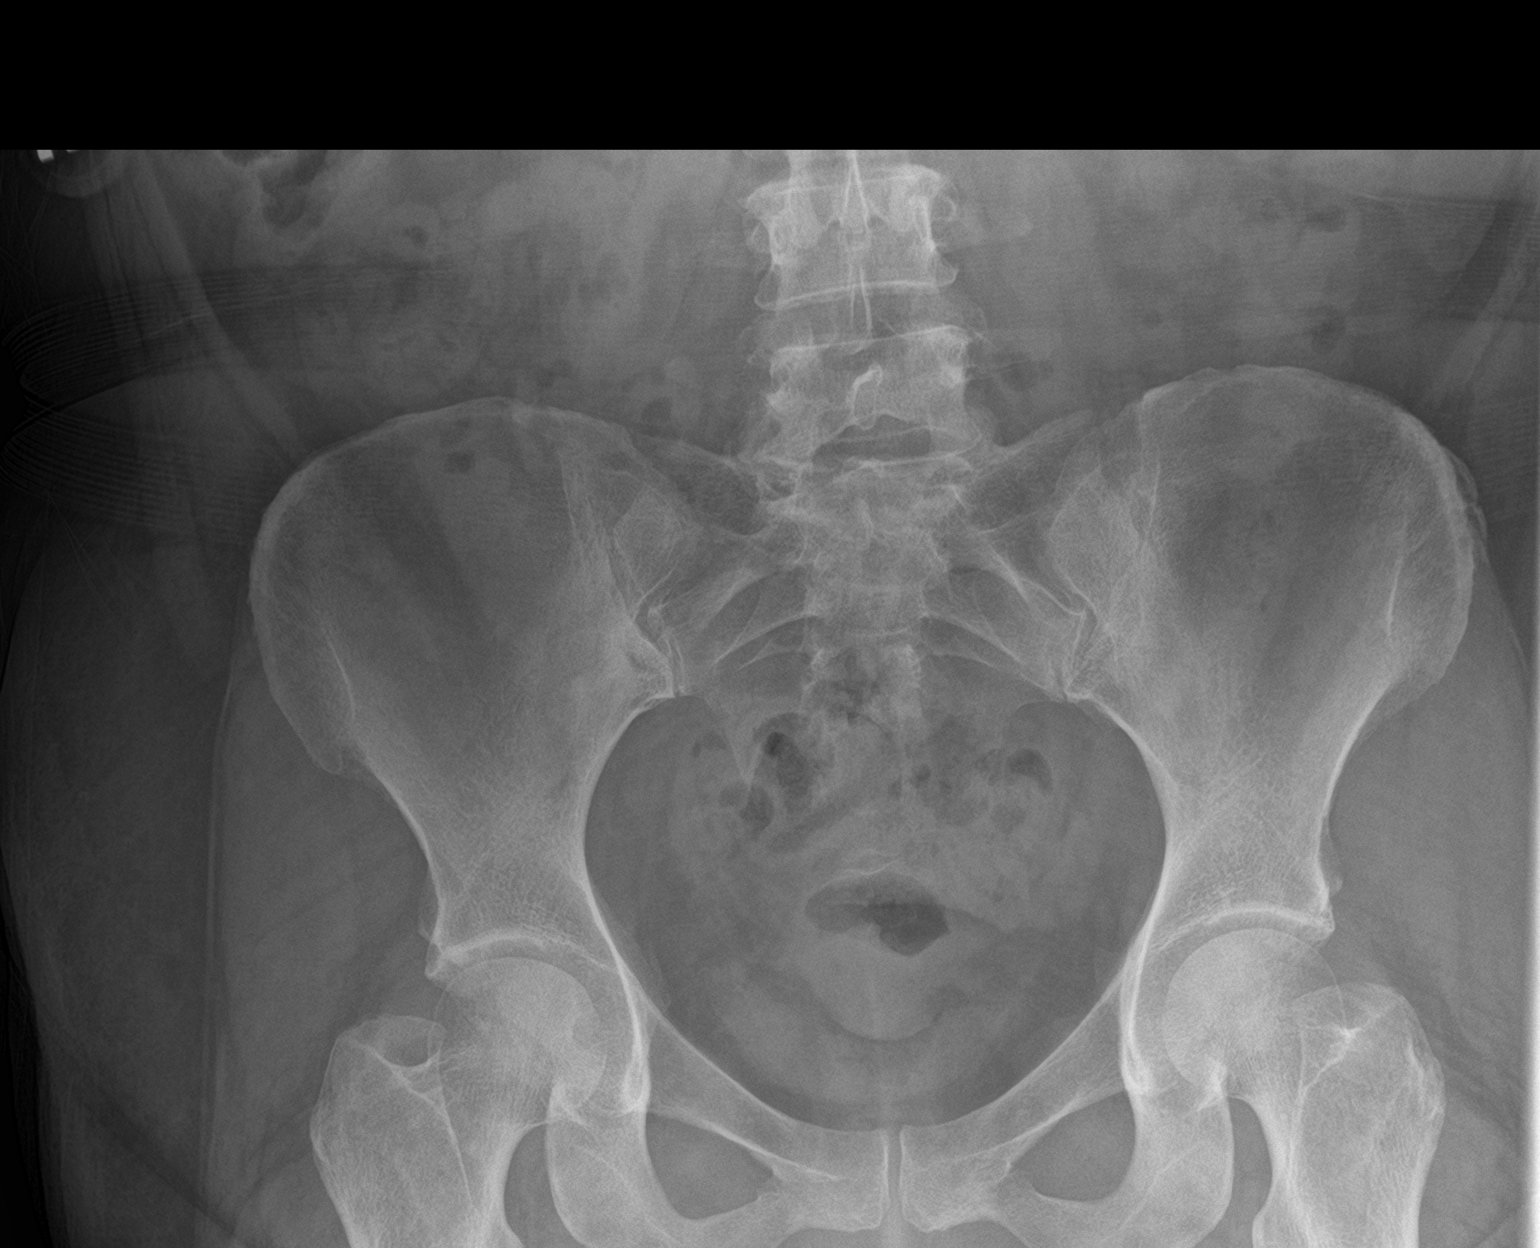

[2 of 2 positions shown; findings below may reference images not displayed]

FINDINGS: The bowel gas pattern is normal. No radio-opaque calculi or other
significant radiographic abnormality are seen. No acute osseous
abnormality. Prior cholecystectomy.
IMPRESSION: Negative.
